# Patient Record
Sex: Female | Born: 1935
Health system: Southern US, Community
[De-identification: ages and names within clinical notes are randomized; demographics above are authoritative.]

## PROBLEM LIST (undated history)

## (undated) DIAGNOSIS — M810 Age-related osteoporosis without current pathological fracture: Secondary | ICD-10-CM

## (undated) DIAGNOSIS — K219 Gastro-esophageal reflux disease without esophagitis: Secondary | ICD-10-CM

## (undated) DIAGNOSIS — E079 Disorder of thyroid, unspecified: Secondary | ICD-10-CM

## (undated) DIAGNOSIS — M1990 Unspecified osteoarthritis, unspecified site: Secondary | ICD-10-CM

## (undated) HISTORY — DX: Disorder of thyroid, unspecified: E07.9

## (undated) HISTORY — DX: Gastro-esophageal reflux disease without esophagitis: K21.9

## (undated) HISTORY — DX: Age-related osteoporosis without current pathological fracture: M81.0

## (undated) HISTORY — DX: Unspecified osteoarthritis, unspecified site: M19.90

---

## 2001-04-17 ENCOUNTER — Ambulatory Visit: Admission: RE | Admit: 2001-04-17 | Discharge: 2001-07-16 | Payer: Self-pay | Admitting: Radiation Oncology

## 2004-02-18 ENCOUNTER — Emergency Department (HOSPITAL_COMMUNITY): Admission: EM | Admit: 2004-02-18 | Discharge: 2004-02-18 | Payer: Self-pay | Admitting: Emergency Medicine

## 2004-03-20 ENCOUNTER — Encounter: Admission: RE | Admit: 2004-03-20 | Discharge: 2004-03-20 | Payer: Self-pay | Admitting: Orthopedic Surgery

## 2005-08-31 IMAGING — CR DG ANKLE COMPLETE 3+V*L*
3 series · 3 of 3 positions shown · non-contrast
Comparison: none

CLINICAL DATA: Fall.  Left ankle twisting injury.  Pain and swelling.
 LEFT ANKLE ? 3 VIEW:
 An oblique nondisplaced fracture of the distal fibula is seen just superior to the level of the tibial plafond.  There is no evidence of distal tibial fracture or other ankle fracture.  The talus remains centered within the ankle mortise.  A small ankle joint effusion is noted.

[view not recorded (1 of 3)]
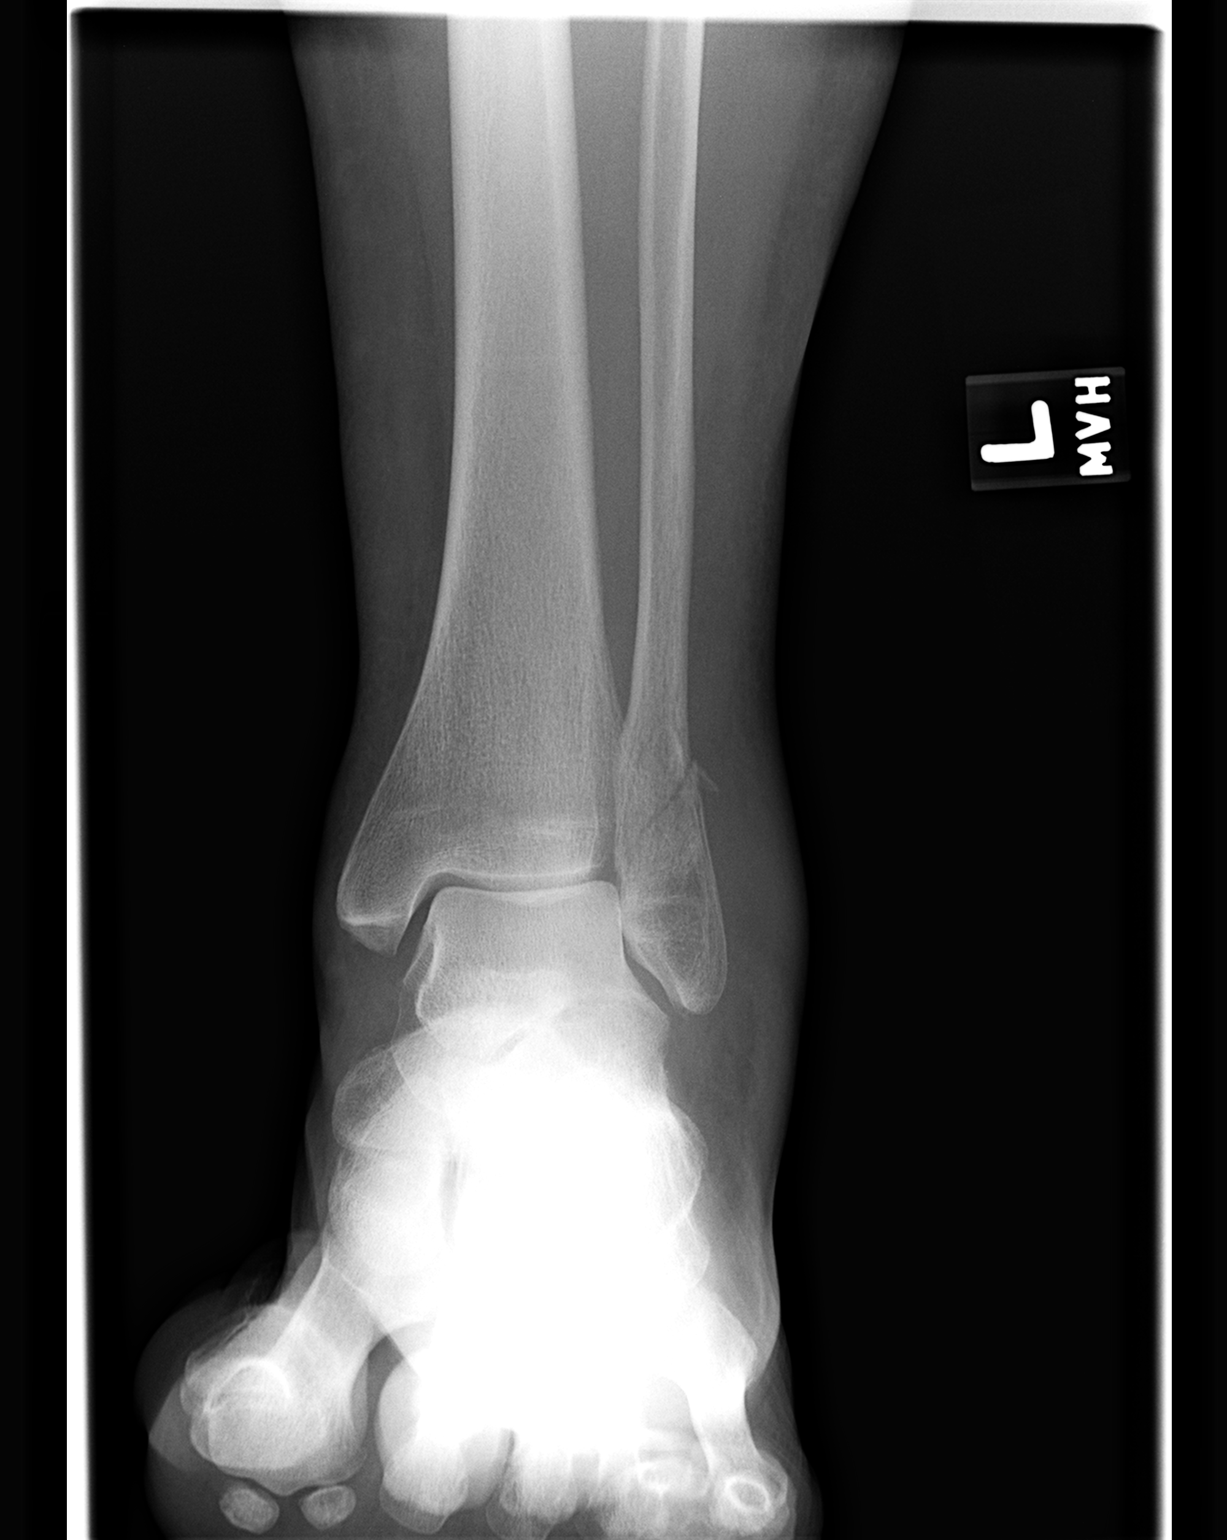

[view not recorded (2 of 3)]
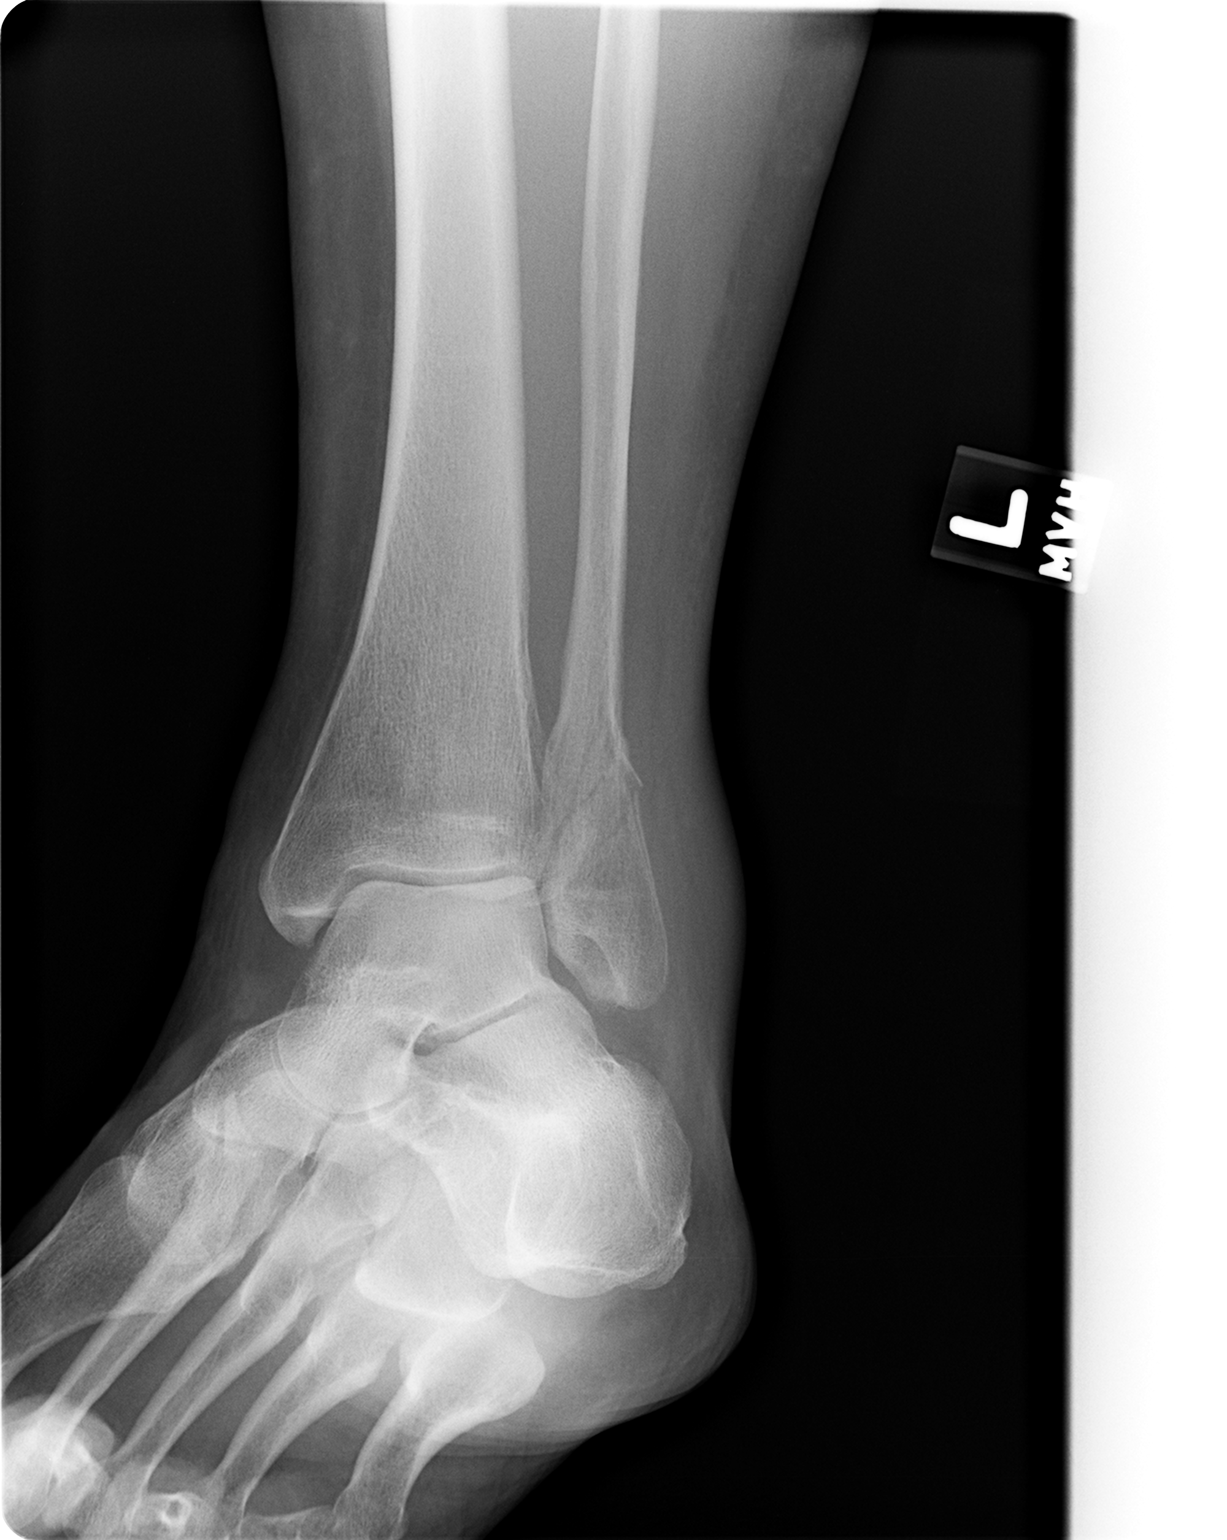

[view not recorded (3 of 3)]
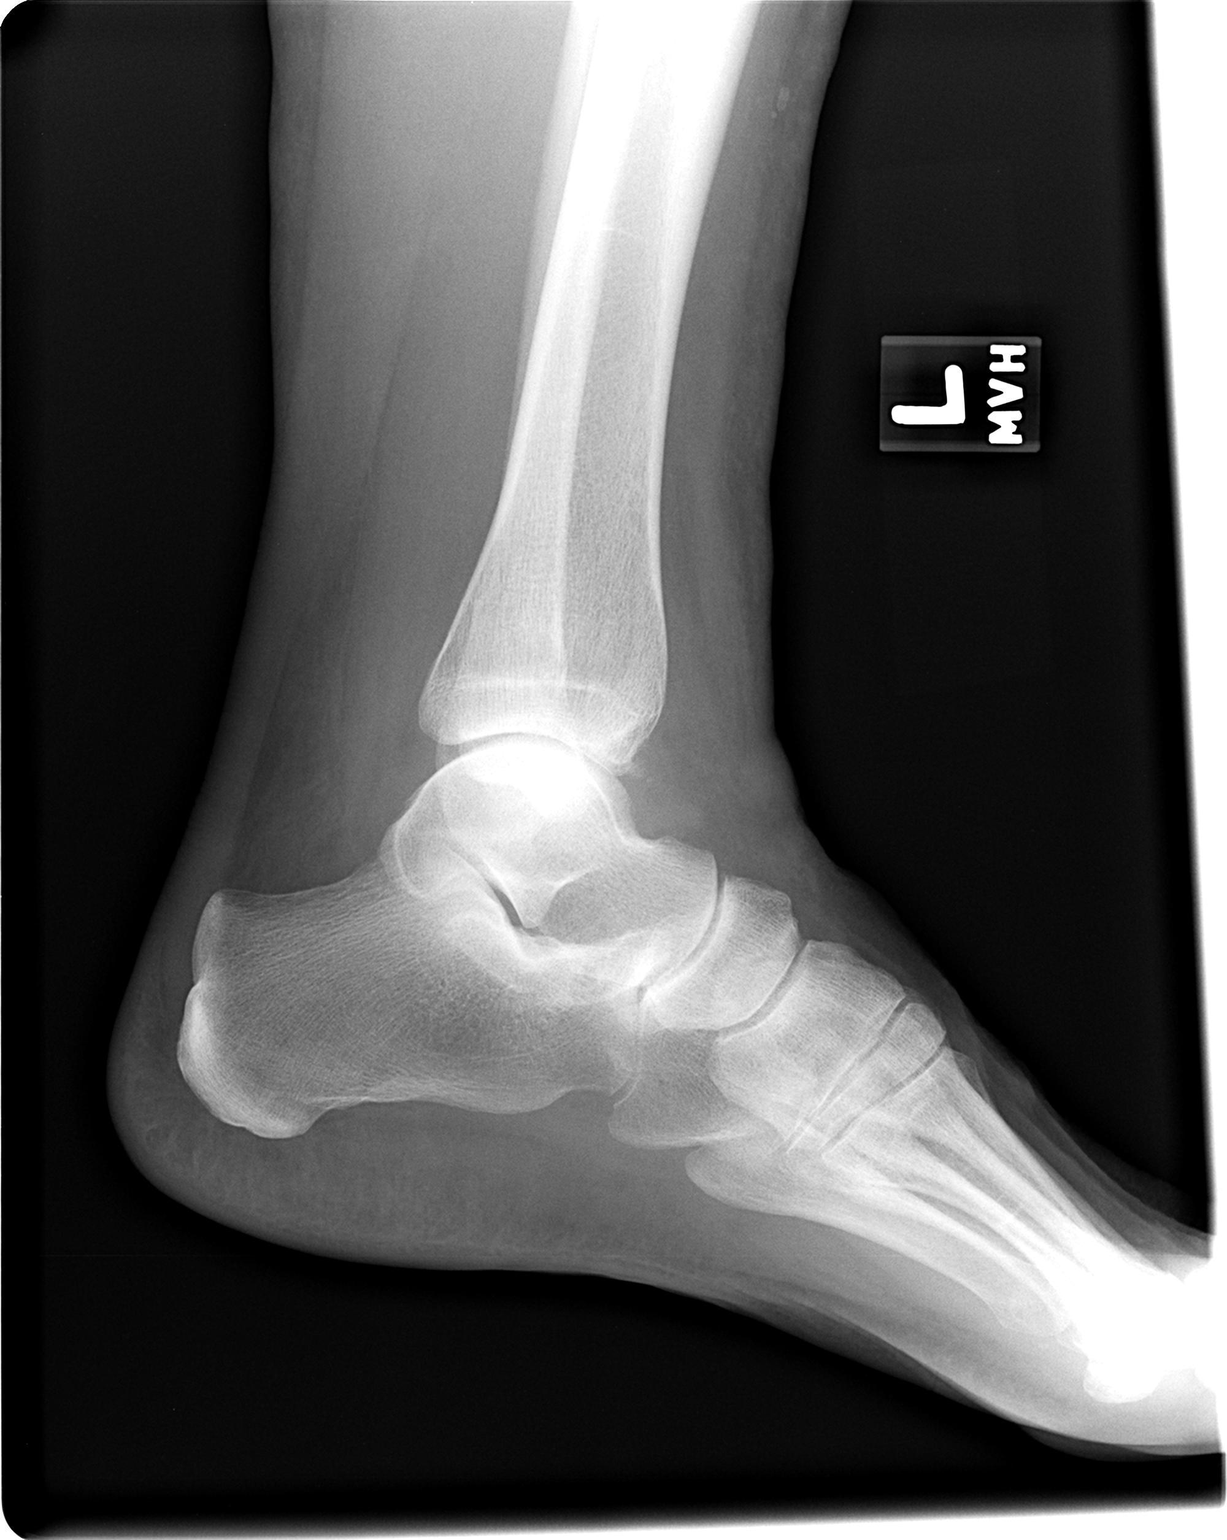

[3 of 3 positions shown; findings below may reference images not displayed]

IMPRESSION: Nondisplaced oblique fracture of the distal fibula, just superior to the level of the tibial plafond.

## 2014-12-20 DIAGNOSIS — H3560 Retinal hemorrhage, unspecified eye: Secondary | ICD-10-CM | POA: Insufficient documentation

## 2014-12-20 DIAGNOSIS — R233 Spontaneous ecchymoses: Secondary | ICD-10-CM | POA: Insufficient documentation

## 2014-12-20 LAB — TSH: TSH: 1.87 u[IU]/mL (ref 0.41–5.90)

## 2014-12-20 LAB — LIPID PANEL
Cholesterol: 210 mg/dL — AB (ref 0–200)
HDL: 86 mg/dL — AB (ref 35–70)
LDL Cholesterol: 114 mg/dL
Triglycerides: 53 mg/dL (ref 40–160)

## 2014-12-20 LAB — HEPATIC FUNCTION PANEL
ALT: 20 U/L (ref 7–35)
AST: 25 U/L (ref 13–35)

## 2014-12-20 LAB — BASIC METABOLIC PANEL
BUN: 17 mg/dL (ref 4–21)
CREATININE: 0.7 mg/dL (ref 0.5–1.1)
GLUCOSE: 95 mg/dL
POTASSIUM: 4.9 mmol/L (ref 3.4–5.3)
Sodium: 140 mmol/L (ref 137–147)

## 2015-01-10 LAB — CBC AND DIFFERENTIAL
HCT: 36 % (ref 36–46)
Hemoglobin: 12 g/dL (ref 12.0–16.0)
Platelets: 286 10*3/uL (ref 150–399)
WBC: 5.9 10*3/mL

## 2015-08-09 ENCOUNTER — Encounter: Payer: Self-pay | Admitting: Family Medicine

## 2015-08-09 ENCOUNTER — Ambulatory Visit (INDEPENDENT_AMBULATORY_CARE_PROVIDER_SITE_OTHER): Payer: Medicare PPO | Admitting: Family Medicine

## 2015-08-09 VITALS — BP 130/82 | HR 80 | Temp 98.0°F | Resp 12 | Ht 60.0 in | Wt 107.0 lb

## 2015-08-09 DIAGNOSIS — M542 Cervicalgia: Secondary | ICD-10-CM | POA: Insufficient documentation

## 2015-08-09 DIAGNOSIS — L658 Other specified nonscarring hair loss: Secondary | ICD-10-CM | POA: Diagnosis not present

## 2015-08-09 DIAGNOSIS — E039 Hypothyroidism, unspecified: Secondary | ICD-10-CM | POA: Insufficient documentation

## 2015-08-09 DIAGNOSIS — E038 Other specified hypothyroidism: Secondary | ICD-10-CM | POA: Diagnosis not present

## 2015-08-09 LAB — T4, FREE: Free T4: 0.76 ng/dL (ref 0.60–1.60)

## 2015-08-09 LAB — T3, FREE: T3 FREE: 3.1 pg/mL (ref 2.3–4.2)

## 2015-08-09 LAB — TSH: TSH: 1.45 u[IU]/mL (ref 0.35–4.50)

## 2015-08-09 MED ORDER — THYROID 60 MG PO TABS: 60.0000 mg | ORAL_TABLET | Freq: Every day | ORAL | Status: DC

## 2015-08-09 NOTE — Patient Instructions (Addendum)
A few things to remember from today's visit:   Other specified hypothyroidism - Plan: TSH, T4, free, T3, free   We have ordered labs or studies at this visit.    It can take up to 1-2 weeks for results and processing. IF results require follow up or explanation, we will call you with instructions. Clinically stable results will be released to your [facility]. If you have not heard from [place] or cannot find your results in [facility] in 2 weeks please contact our office at [phone].  If you are not yet signed up for [facility], please consider signing up  I will see her back when you doing physical. Keep appointment with dermatology and continue physical therapy/ortho for your neck pain. Remember to be cautious with falls, some more slowly.  Please be sure medication list is accurate. If a new problem present, please set up appointment sooner than planned today.

## 2015-08-09 NOTE — Progress Notes (Signed)
Pre visit review using our clinic review tool, if applicable. No additional management support is needed unless otherwise documented below in the visit note. 

## 2015-08-09 NOTE — Progress Notes (Signed)
HPI:   [name] is a 82 y.o. female, who is here today to follow on thyroid disease. She has been a patient of mine for about a year now, former patient of Development worker, community.    Hypothyroidism:  Currently she is on Armour Thyroid 60 mg daily. She states that she tried Synthroid and other medications but they didn't seem to work, she been taking this one for about 40 years.  Tolerating medication well, no side effects reported. She has not noted dysphagia, palpitations, abdominal pain, changes in bowel habits, tremor, cold/heat intolerance, or abnormal weight loss.  Concerns today: Hair loss.  She has history of chronic upper back/cervical pain, she is following with orthopedist and currently she is doing physical therapy and home exercises, going through dry needle therapy, which seems to be helping some.  She also follows with a dermatologist periodically, next appointment in the next couple weeks. Today she is complaining of hair loss, she doesn't have any alopecic areas but she states that her hair is getting "thinner." She has not noted scalp lesions.   She lives with husband. Independent ADL's and IADL's. No falls in the past year and denies depression symptoms.    Review of Systems  Constitutional: Negative for fever, activity change, appetite change, fatigue and unexpected weight change.  HENT: Negative for mouth sores, nosebleeds and trouble swallowing.   Eyes: Negative for redness and visual disturbance.  Respiratory: Negative for cough, shortness of breath and wheezing.   Cardiovascular: Negative for chest pain, palpitations and leg swelling.  Gastrointestinal: Negative for nausea, vomiting and abdominal pain.       Negative for changes in bowel habits.  Endocrine: Negative for cold intolerance and heat intolerance.  Genitourinary: Negative for dysuria, hematuria, decreased urine volume and difficulty urinating.  Musculoskeletal: Positive for back  pain and arthralgias. Negative for gait problem.  Skin: Negative for color change and rash.  Neurological: Negative for tremors, seizures, syncope, weakness, numbness and headaches.  Psychiatric/Behavioral: Negative for confusion. The patient is not nervous/anxious.       No current outpatient prescriptions on file prior to visit.   No current facility-administered medications on file prior to visit.     History reviewed. No pertinent past medical history. Not on File  Social History   Social History  . Marital Status: Married    Spouse Name: N/A  . Number of Children: N/A  . Years of Education: N/A   Social History Main Topics  . Smoking status: None  . Smokeless tobacco: None  . Alcohol Use: None  . Drug Use: None  . Sexual Activity: Not Asked   Other Topics Concern  . None   Social History Narrative  . None    Filed Vitals:   08/09/15 0936  BP: 130/82  Pulse: 80  Temp: 98 F (36.7 C)  Resp: 12   Body mass index is 20.9 kg/(m^2).   SpO2 Readings from Last 3 Encounters:  08/09/15 98%      Physical Exam  Nursing note and vitals reviewed. Constitutional: She is oriented to person, place, and time. She appears well-developed. No distress.  HENT:  Head: Atraumatic.  Mouth/Throat: Oropharynx is clear and moist and mucous membranes are normal.  Eyes: Conjunctivae and EOM are normal. Pupils are equal, round, and reactive to light.  Neck: No tracheal deviation present. No thyroid mass and no thyromegaly present.  Cardiovascular: Normal rate and regular rhythm.   No murmur heard. Pulses:  Dorsalis pedis pulses are 2+ on the right side, and 2+ on the left side.  Respiratory: Effort normal and breath sounds normal. No respiratory distress.  GI: Soft. She exhibits no mass. There is no tenderness.  Musculoskeletal: She exhibits no edema.  Lymphadenopathy:    She has no cervical adenopathy.  Neurological: She is alert and oriented to person, place, and  time. She has normal strength. No cranial nerve deficit. Coordination normal.  Stable gait without assistance  Skin: Skin is warm. No erythema.  No alopecic areas noted or scalp lesions.  Psychiatric: She has a normal mood and affect.  Well groomed, good eye contact.      ASSESSMENT AND PLAN:     Adriana Peters was seen today for new patient (initial visit).  Diagnoses and all orders for this visit:  Other specified hypothyroidism  No changes in current management, will follow labs done today and will give further recommendations accordingly. -     TSH -     T4, free -     T3, free -     thyroid (ARMOUR THYROID) 60 MG tablet; Take 1 tablet (60 mg total) by mouth daily before breakfast.   Other specified nonscarring hair loss  In regard to hair loss, he seems to be chronic, she has an appointment coming up with dermatology;so I recommended to discussed this problem during her next office visit.     -Ms. was advised to return sooner than planned today if new concerns arise, she voices understanding.       [name]. [name], MD  [facility]. [place] office.

## 2015-08-15 ENCOUNTER — Telehealth: Payer: Self-pay | Admitting: Family Medicine

## 2015-08-15 NOTE — Telephone Encounter (Signed)
Received PA request for Armour Thyroid 60mg  tablet. PA submitted & is pending. [id number]

## 2015-09-04 ENCOUNTER — Telehealth: Payer: Self-pay

## 2015-09-04 NOTE — Telephone Encounter (Signed)
Prior Authorization request for thyroid (ARMOUR THYROID) 60 MG tablet denial received.  Denied under Medicare Part B:  The drug you asked for is non-formulary. Before the drug can be covered, we need more information. Please ask your prescriber to explain to [facility] why the preferred drugs have not worked for your medical condition and/or would have bad side effects. The list of drugs offered by your current [facility] contract can be found in your 2017 Prescription Drug Guide. This was sent to you with your benefit packet. If you have not tried the preferred drugs, including levothyroxine tablet, please talk to your health care provider about prescribing one of these for you. Some preferred drugs may require an additional review and approval by [facility]. This determination was based on the [employer] Non-Formulary Exceptions Coverage Policy.

## 2015-09-26 ENCOUNTER — Telehealth: Payer: Self-pay | Admitting: Family Medicine

## 2015-09-26 DIAGNOSIS — Z13820 Encounter for screening for osteoporosis: Secondary | ICD-10-CM

## 2015-09-26 NOTE — Telephone Encounter (Signed)
Referral okay? 

## 2015-09-26 NOTE — Telephone Encounter (Signed)
The patient needs a referral to [facility].  [id number] [place], [place] [address] [phone] Fax: [phone]  She had her Bone Density done on 06/11/2013  Pulmonary Nodules 09/16/2014 and she needs them rechecked 10/17/15. She will make that appointment after 10/17/15.

## 2015-09-27 NOTE — Telephone Encounter (Signed)
Order placed

## 2015-09-27 NOTE — Telephone Encounter (Signed)
It is Ok to place referral for DEXA. Medicare guidelines changed, so we need a Dx that covers it. Thanks, BJ

## 2015-09-29 ENCOUNTER — Telehealth: Payer: Self-pay | Admitting: Family Medicine

## 2015-09-29 NOTE — Telephone Encounter (Signed)
Adriana Peters from [facility] needs a new bone density order. The order that they received doesn't have a date of birth or a referring doctors signature.   Order #: [id number]  Fax #: [phone]

## 2015-10-03 NOTE — Telephone Encounter (Signed)
New order faxed to [place].

## 2015-10-03 NOTE — Telephone Encounter (Signed)
Order page with patient's date of birth is printed & waiting for MD signature.

## 2015-10-11 ENCOUNTER — Telehealth: Payer: Self-pay | Admitting: Family Medicine

## 2015-10-11 NOTE — Telephone Encounter (Signed)
Pt would like to have a referral to have her Pulmonary Nodules checked and pls send it to:  [facility]   [place], [place]  [phone]  [phone] (F) (Same place she had her bone density done)

## 2015-10-11 NOTE — Telephone Encounter (Signed)
Order okay? 

## 2015-10-13 NOTE — Telephone Encounter (Signed)
Does she need order for CT or referral to provider who is following lung nodule? I think she is following with cardiothoracic surgeon or pulmonologist for this problem. Thanks, BJ

## 2015-10-13 NOTE — Telephone Encounter (Signed)
Left voicemail for patient to call the office back.   

## 2015-10-16 ENCOUNTER — Other Ambulatory Visit: Payer: Self-pay | Admitting: Family Medicine

## 2015-10-16 ENCOUNTER — Telehealth: Payer: Self-pay

## 2015-10-16 ENCOUNTER — Encounter: Payer: Self-pay | Admitting: Family Medicine

## 2015-10-16 DIAGNOSIS — R911 Solitary pulmonary nodule: Secondary | ICD-10-CM

## 2015-10-16 NOTE — Telephone Encounter (Signed)
She needs the referral for the CT and it has to be sent to [facility] in [place].

## 2015-10-16 NOTE — Telephone Encounter (Signed)
Referral for CT w/o contrast placed. I found report from prior CT done 08/2014, it was small and f/u recommended based on risk factors for lung cancer.   [Stable 5 mm left pulmonary nodule. Followup should be based on  Fleischner criteria. Please see below.  2.Moderate sliding hiatal hernia.]  Thanks, BJ

## 2015-10-16 NOTE — Telephone Encounter (Signed)
Order printed and signed by provider. Order requisition given to referrals to get authorized.

## 2015-10-16 NOTE — Telephone Encounter (Signed)
Called and left message for patient letting her know that we were able to have her records resent from [facility] @ [place].

## 2015-10-24 ENCOUNTER — Encounter: Payer: Self-pay | Admitting: Family Medicine

## 2015-10-24 DIAGNOSIS — G8929 Other chronic pain: Secondary | ICD-10-CM | POA: Insufficient documentation

## 2015-10-24 DIAGNOSIS — M545 Low back pain, unspecified: Secondary | ICD-10-CM | POA: Insufficient documentation

## 2016-02-08 ENCOUNTER — Ambulatory Visit (INDEPENDENT_AMBULATORY_CARE_PROVIDER_SITE_OTHER): Payer: Medicare PPO | Admitting: Family Medicine

## 2016-02-08 ENCOUNTER — Encounter: Payer: Self-pay | Admitting: Family Medicine

## 2016-02-08 VITALS — BP 148/68 | HR 90 | Temp 98.2°F | Resp 12 | Wt 108.0 lb

## 2016-02-08 DIAGNOSIS — K219 Gastro-esophageal reflux disease without esophagitis: Secondary | ICD-10-CM

## 2016-02-08 DIAGNOSIS — R030 Elevated blood-pressure reading, without diagnosis of hypertension: Secondary | ICD-10-CM

## 2016-02-08 DIAGNOSIS — K449 Diaphragmatic hernia without obstruction or gangrene: Secondary | ICD-10-CM

## 2016-02-08 DIAGNOSIS — W19XXXA Unspecified fall, initial encounter: Secondary | ICD-10-CM | POA: Diagnosis not present

## 2016-02-08 DIAGNOSIS — R911 Solitary pulmonary nodule: Secondary | ICD-10-CM

## 2016-02-08 DIAGNOSIS — S3992XA Unspecified injury of lower back, initial encounter: Secondary | ICD-10-CM | POA: Diagnosis not present

## 2016-02-08 NOTE — Progress Notes (Signed)
HPI:   [name] is a 83 y.o. female, who is here today to follow on some of her chronic problems and with some concerns after chest CT was done to follow on pulmonary nodule.   Hx of cervical pain, she is doing "fine." She is doing PT exercises.  -Hx of solitary pulmonary nodule, left lung. 09/2015 CT showed stable 6 mm nodule. No further follow up is necessary. She is concerned about hiatal hernia, which has been also reported when she has CT's done. She noted that previously hiatal hernia was reported as mild and now moderate. She has had heartburn, has refused medications.She takes vineger with baking soda and helps but she doe snot take it daily. She notes symptoms mainly at night,right after taking shower and getting ready to go to bed.  She is trying to follow GERD recommendations, follows a healthy diet. Drinks wine at night and goes to bed about 2 hours after dinner, + vomiting undigested food. She denies dysphagia.  Denies abdominal pain,changes in bowel habits, blood in stool or melena.  -Fall: 02/01/16 she fell at home when she was walking a narrow space between dishwasher and kitchen furniture. She landed on dishes, right rib cage. She is still "sore", no ecchymosis or deformity. Pain is 8/10, no radiated, , exacerbated by lying on right side or certain movements.Allicated by sitting/rest. Improving.  She denies cough, dyspnea, or wheezing.   She is exercising regularly, following Osteo Strong exercises to help with bone density. She states that she has gained strength and feels much better. She is independent and does not use cane or assistance for transfer.  BP elevated today. She does not check BP regularly, no Hx of HTN.     Review of Systems  Constitutional: Negative for activity change, appetite change, fatigue, fever and unexpected weight change.  HENT: Negative for mouth sores, nosebleeds and trouble swallowing.   Eyes: Negative for visual  disturbance.  Respiratory: Negative for cough, shortness of breath and wheezing.   Cardiovascular: Negative for chest pain, palpitations and leg swelling.  Gastrointestinal: Positive for vomiting. Negative for abdominal pain and blood in stool.       No changes in bowel habits.  Endocrine: Negative for cold intolerance and heat intolerance.  Genitourinary: Negative for decreased urine volume and hematuria.  Musculoskeletal: Positive for myalgias. Negative for gait problem.  Skin: Negative for rash.  Neurological: Negative for syncope, weakness and headaches.  Psychiatric/Behavioral: Negative for confusion and sleep disturbance.      Current Outpatient Prescriptions on File Prior to Visit  Medication Sig Dispense Refill  . thyroid (ARMOUR THYROID) 60 MG tablet Take 1 tablet (60 mg total) by mouth daily before breakfast. 90 tablet 2   No current facility-administered medications on file prior to visit.      No past medical history on file. Not on File  Social History   Social History  . Marital status: Married    Spouse name: N/A  . Number of children: N/A  . Years of education: N/A   Social History Main Topics  . Smoking status: Never Smoker  . Smokeless tobacco: Never Used  . Alcohol use Not on file  . Drug use: Unknown  . Sexual activity: Not on file   Other Topics Concern  . Not on file   Social History Narrative  . No narrative on file    Vitals:   02/08/16 1316  BP: (!) 148/68  Pulse: 90  Resp:  12  Temp: 98.2 F (36.8 C)   Body mass index is 21.09 kg/m.    Physical Exam  Nursing note and vitals reviewed. Constitutional: She is oriented to person, place, and time. She appears well-developed and well-nourished. No distress.  HENT:  Head: Atraumatic.  Mouth/Throat: Oropharynx is clear and moist and mucous membranes are normal.  Eyes: Conjunctivae and EOM are normal. Pupils are equal, round, and reactive to light.  Cardiovascular: Normal rate and  regular rhythm.   No murmur heard. Pulses:      Dorsalis pedis pulses are 2+ on the right side, and 2+ on the left side.  Respiratory: Effort normal and breath sounds normal. No respiratory distress.  GI: Soft. She exhibits no mass. There is no hepatomegaly. There is no tenderness.  Musculoskeletal: She exhibits no edema.  Right low back tenderness upon palpation, posterior rib cage. No deformity appreciated.  Lymphadenopathy:    She has no cervical adenopathy.  Neurological: She is alert and oriented to person, place, and time. She has normal strength. Coordination normal.  Stable gait with no assistance needed.   Skin: Skin is warm. No ecchymosis and no rash noted. No erythema.  Psychiatric: Her mood appears anxious.  Well groomed, good eye contact.      ASSESSMENT AND PLAN:     Debrah was seen today for fall.  Diagnoses and all orders for this visit:   Accidental fall, initial encounter  Fall precautions discussed. She will continue regular strengthening exercises.    Soft tissue injury of back, initial encounter  Examination today does not suggest serious trauma/injury. I do not think imaging is needed today. Acetaminophen and topical OTC Asper cream may help. Instructed about warning signs. F/U as needed.   Hiatal hernia with GERD  We discussed characteristics of disease. She is symptomatic. Recommended trial of PPI but she refuses medications.She will prefer to try vinegar with baking soda but daily. GERD precautions discussed. She will let me know in 3-4 weeks if home remedy is helping, then she tells me she may agree with Omeprazole.   Elevated blood pressure reading  Re-checked 145/70. Monitor BP 1-2 times daily recommended. F/U in 4 months.  Solitary pulmonary nodule  Stable. No further imaging needed.    -[name] was advised to return sooner than planned today if new concerns arise.       Maimouna Rondeau G. [name], MD  [facility]. [place] office.

## 2016-02-08 NOTE — Progress Notes (Signed)
Pre visit review using our clinic review tool, if applicable. No additional management support is needed unless otherwise documented below in the visit note. 

## 2016-02-08 NOTE — Patient Instructions (Addendum)
A few things to remember from today's visit:   Hiatal hernia with GERD    Avoid foods that make your symptoms worse, for example coffee, chocolate,pepermeint,alcohol, and greasy food. Raising the head of your bed about 6 inches may help with nocturnal symptoms.   Avoid lying down for 3 hours after eating.  Instead 3 large meals daily try small and more frequent meals during the day.    You should be evaluated immediately if bloody vomiting, bloody stools, black stools (like tar), difficulty swallowing, food gets stuck on the way down or choking when eating. Abnormal weight loss or severe abdominal pain.  If symptoms are not resolved sometimes endoscopy is necessary.   Caution with falls. Blood pressure at home.    Please be sure medication list is accurate. If a new problem present, please set up appointment sooner than planned today.

## 2016-02-11 ENCOUNTER — Encounter: Payer: Self-pay | Admitting: Family Medicine

## 2016-02-12 ENCOUNTER — Encounter: Payer: Self-pay | Admitting: Family Medicine

## 2016-02-12 ENCOUNTER — Ambulatory Visit (INDEPENDENT_AMBULATORY_CARE_PROVIDER_SITE_OTHER)
Admission: RE | Admit: 2016-02-12 | Discharge: 2016-02-12 | Disposition: A | Discharge status: Home / Self Care | Payer: Medicare PPO | Source: Ambulatory Visit | Attending: Family Medicine | Admitting: Family Medicine

## 2016-02-12 ENCOUNTER — Ambulatory Visit (INDEPENDENT_AMBULATORY_CARE_PROVIDER_SITE_OTHER): Payer: Medicare PPO | Admitting: Family Medicine

## 2016-02-12 VITALS — BP 112/78 | HR 78 | Resp 12 | Ht 60.0 in | Wt 106.5 lb

## 2016-02-12 DIAGNOSIS — W19XXXD Unspecified fall, subsequent encounter: Secondary | ICD-10-CM | POA: Diagnosis not present

## 2016-02-12 DIAGNOSIS — M545 Low back pain, unspecified: Secondary | ICD-10-CM

## 2016-02-12 DIAGNOSIS — R0781 Pleurodynia: Secondary | ICD-10-CM

## 2016-02-12 MED ORDER — DICLOFENAC SODIUM 1 % TD GEL: 4.0000 g | Freq: Four times a day (QID) | TRANSDERMAL | 1 refills | Status: DC

## 2016-02-12 NOTE — Progress Notes (Signed)
Pre visit review using our clinic review tool, if applicable. No additional management support is needed unless otherwise documented below in the visit note. 

## 2016-02-12 NOTE — Patient Instructions (Signed)
  [name] I have seen you today for an acute visit.  1. Right-sided low back pain without sciatica, unspecified chronicity  - DG Lumbar Spine Complete; Future  2. Costal margin pain  - DG Ribs Unilateral Right; Future  3. Accidental fall, subsequent encounter  - DG Ribs Unilateral Right; Future - DG Lumbar Spine Complete; Future    In general please monitor for signs of worsening symptoms and seek immediate medical attention if any concerning/warning symptom.

## 2016-02-12 NOTE — Progress Notes (Signed)
HPI:  ACUTE VISIT:  Chief Complaint  Patient presents with  . Follow-up    Adriana Peters is a 83 y.o. female, who is here today complaining of worsening right lower back pain and rib cage for the past few days.  I saw her recently, 02/08/2016 when she reported accidental fall at home on 02/01/2016. According to patient, pain has been "much worse",intermittent.It is exacerbated by certain activities that involve bending, pulling, and reaching. Localized mainly on right low back but also having some on rib cage.  Sharp, 9-10/10, no radiated. Denies saddle anesthesia.   He is alleviated by rest and sitting. She has not noted any deformity. She is reporting history of scoliosis and is concerned that all might have caused problems with her back.  She denies numbness, tingling, cough, dyspnea, wheezing, or abdominal pain. She is using home remedies to manage pain.  She has Hx of chronic cervical and lower back pain as well as osteopenia.     Review of Systems  Constitutional: Negative for activity change, appetite change, fatigue and fever.  Respiratory: Negative for cough, shortness of breath and wheezing.   Cardiovascular: Negative for chest pain.  Gastrointestinal: Negative for abdominal pain, nausea and vomiting.       Negative for changes in bowel habits or fecal incontinence.  Genitourinary: Negative for decreased urine volume and hematuria.       Negative for urine incontinence.  Musculoskeletal: Positive for back pain. Negative for neck pain.  Skin: Negative for rash.  Neurological: Negative for syncope, weakness, numbness and headaches.  Psychiatric/Behavioral: Negative for confusion. The patient is nervous/anxious.       Current Outpatient Prescriptions on File Prior to Visit  Medication Sig Dispense Refill  . thyroid (ARMOUR THYROID) 60 MG tablet Take 1 tablet (60 mg total) by mouth daily before breakfast. 90 tablet 2   No current  facility-administered medications on file prior to visit.      No past medical history on file. Not on File  Social History   Social History  . Marital status: Married    Spouse name: N/A  . Number of children: N/A  . Years of education: N/A   Social History Main Topics  . Smoking status: Never Smoker  . Smokeless tobacco: Never Used  . Alcohol use Yes     Comment: Wine  . Drug use: No  . Sexual activity: Not Asked   Other Topics Concern  . None   Social History Narrative  . None    Vitals:   02/12/16 1122  BP: 112/78  Pulse: 78  Resp: 12   O2 sat 99% at RA.  Body mass index is 20.8 kg/m.  Physical Exam  Nursing note and vitals reviewed. Constitutional: She is oriented to person, place, and time. She appears well-developed. She does not appear ill. No distress.  HENT:  Head: Atraumatic.  Eyes: Conjunctivae and EOM are normal.  Cardiovascular: Normal rate and regular rhythm.   Respiratory: Effort normal and breath sounds normal. No respiratory distress.  Musculoskeletal: She exhibits no edema.  Mild tenderness upon palpation of paraspinal muscles, right L2-3-4 with mild spasm. Right rib cage also tender with palpation. + Scoliosis. Pain elicited with movement. Shoulder ROM is not limited and doe snot elicit pain. No local edema or erythema appreciated, no suspicious lesions.     Neurological: She is alert and oriented to person, place, and time. She has normal strength. Coordination and gait normal.  Skin: Skin  is warm. No rash noted. No erythema.  Psychiatric: Her mood appears anxious.  Well groomed, good eye contact.      ASSESSMENT AND PLAN:     Fayma was seen today for follow-up.  Diagnoses and all orders for this visit:  Right-sided low back pain without sciatica, unspecified chronicity  Acute exacerbation of a chronic problem.  Most likely soft tissue trauma. She agrees with trying Voltaren gel. She can also try acetaminophen OTC  500 mg 3 times per day. She does not want oral analgesic Rx. Recommend avoiding activities that exacerbate pain. Instructed about warning signs.  -     DG Lumbar Spine Complete; Future -     diclofenac sodium (VOLTAREN) 1 % GEL; Apply 4 g topically 4 (four) times daily.  Costal margin pain  Examination does not suggest rib fracture, further recommendations would be given according to imaging report.   -     DG Ribs Unilateral Right; Future -     diclofenac sodium (VOLTAREN) 1 % GEL; Apply 4 g topically 4 (four) times daily.  Accidental fall, subsequent encounter  Fall precautions were discussed extensively last office visit, will review some today.  -     DG Ribs Unilateral Right; Future -     DG Lumbar Spine Complete; Future    -[name] was advised to return or notify a doctor immediately if symptoms worsen or persist or new concerns arise.       Melanee Cordial G. [name], MD  [facility]. [place] office.

## 2016-05-02 ENCOUNTER — Other Ambulatory Visit: Payer: Self-pay

## 2016-05-02 DIAGNOSIS — E038 Other specified hypothyroidism: Secondary | ICD-10-CM

## 2016-05-02 MED ORDER — THYROID 60 MG PO TABS: 60.0000 mg | ORAL_TABLET | Freq: Every day | ORAL | 1 refills | Status: DC

## 2016-06-03 NOTE — Progress Notes (Signed)
HPI:   [name] is a 83 y.o. female, who is here today to follow on some chronic medical problems.  Last seen for acute visit on 02/12/16, rib fractures after fall at home. Pain has resolved.  Hypothyroidism:  Dx about 40+ years.  Currently she is on Armour Thyroid 60 mg daily. . Tolerating medication well, no side effects reported. She has not noted dysphagia, palpitations, abdominal pain, changes in bowel habits, tremor, cold/heat intolerance, or abnormal weight loss.  Lab Results  Component Value Date   TSH 1.45 08/09/2015    GERD:  Still having "some throwing up" once per week regurgitation. She wonders if she is eating to much, exacerbated by bending froward after eating. Sometimes "little" discomfort on epigastric area. Apple cider vineger and baking soda help.  She would like to go see GI but for later because she has no time. Hx of hiatal hernia. She has refused trying PPI's or Zantac. Denies abdominal pain, nausea, vomiting, changes in bowel habits, blood in stool or melena.   Cervicalgia: Arnica gel has helped with cervical pain and upper back pain. She occasionally has pain, very mild, depending of physical activity during the day.  Osteoporosis: Still doing osteo-strong for bone and taking Ca++ with Vit D supplementation. She has refused treatment with Fosamax or other medications for osteoporosis, she is afraid of side effects.   Osteopenia last DEXA 10/06/15 some planing on DEXA 10/06/16. She is planning on having DEXA again in 09/2016, states that her health insurance has covered it with no problems.  Concerned about LE varicose veins. She denies pain or erythema. Occasional mild LE edema, worse at the end of the day if she has been walking or prolonged standing.   Review of Systems  Constitutional: Negative for activity change, appetite change, fatigue, fever and unexpected weight change.  HENT: Negative for mouth sores, nosebleeds, sore  throat and trouble swallowing.   Respiratory: Negative for cough, shortness of breath and wheezing.   Cardiovascular: Negative for chest pain, palpitations and leg swelling.  Gastrointestinal: Negative for abdominal pain, blood in stool, nausea and vomiting.       Negative for changes in bowel habits.  Endocrine: Negative for cold intolerance and heat intolerance.  Genitourinary: Negative for decreased urine volume and hematuria.  Musculoskeletal: Negative for gait problem and myalgias.  Skin: Negative for pallor and rash.  Neurological: Negative for syncope, weakness and headaches.  Psychiatric/Behavioral: Negative for confusion and sleep disturbance. The patient is nervous/anxious.       Current Outpatient Prescriptions on File Prior to Visit  Medication Sig Dispense Refill  . thyroid (ARMOUR THYROID) 60 MG tablet Take 1 tablet (60 mg total) by mouth daily before breakfast. 90 tablet 1   No current facility-administered medications on file prior to visit.     Past Medical History:  Diagnosis Date  . Thyroid disease    Hypothyroidism.   No Known Allergies  Social History   Social History  . Marital status: Married    Spouse name: N/A  . Number of children: N/A  . Years of education: N/A   Social History Main Topics  . Smoking status: Never Smoker  . Smokeless tobacco: Never Used  . Alcohol use Yes     Comment: Wine  . Drug use: No  . Sexual activity: Not Asked   Other Topics Concern  . None   Social History Narrative  . None    Vitals:   06/04/16 1050  BP: 122/70  Pulse: 87  Resp: 12  O2 sat at RA 95% Body mass index is 20.56 kg/m.   Physical Exam  Nursing note and vitals reviewed. Constitutional: She is oriented to person, place, and time. She appears well-developed and well-nourished. No distress.  HENT:  Head: Atraumatic.  Mouth/Throat: Oropharynx is clear and moist and mucous membranes are normal.  Eyes: Conjunctivae and EOM are normal. Pupils  are equal, round, and reactive to light.  Neck: No tracheal deviation present. No thyroid mass and no thyromegaly present.  Cardiovascular: Normal rate and regular rhythm.   No murmur heard. Pulses:      Dorsalis pedis pulses are 2+ on the right side, and 2+ on the left side.  Varicose vein LE bilateral, moderate and involving calves and thighs.  Respiratory: Effort normal and breath sounds normal. No respiratory distress.  GI: Soft. She exhibits no mass. There is no hepatomegaly. There is no tenderness.  Musculoskeletal: She exhibits no edema.  Shoulders normal ROM, no pain elicited. Cervical paraspinal muscles no tenderness. Mild limitation of ROM of cervical spine, no pain elicited.   Lymphadenopathy:    She has no cervical adenopathy.  Neurological: She is alert and oriented to person, place, and time. She has normal strength.  Stable gait with no assistance.  Skin: Skin is warm. No erythema.  Psychiatric: She has a normal mood and affect.  Well groomed, good eye contact.     ASSESSMENT AND PLAN:   Adriana Peters was seen today for follow-up.  Diagnoses and all orders for this visit:  Hiatal hernia with GERD  We discussed possible complications of GERD. She is not interested in PPI or other pharmacologic treatment. She would like GI evaluation but not now because she will not have time. She will let me know when she can do it. Instructed about warning signs. GERD precautions discussed and to continue.  Other specified hypothyroidism  No changes in current management, will follow labs done today and will give further recommendations accordingly. F/U in a year.  -     T4, free -     T3, free -     TSH  Osteoporosis without current pathological fracture, unspecified osteoporosis type  She is not interested in pharmacologic treatment. She wants to continue having DEXA annually. We discussed current recommendations for DEXA. Continue Ca++ with D. Fall precautions  discussed.  Varicose veins of both lower extremities  Compression stocking recommended. LE elevation may also help. Skin care. Treatment options discussed but if she is not having significant symptoms I do not recommend invasive procedures.    -Adriana Peters was advised to return sooner than planned today if new concerns arise.       Raahil Ong G. [name], MD  [facility]. [place] office.

## 2016-06-04 ENCOUNTER — Encounter: Payer: Self-pay | Admitting: Family Medicine

## 2016-06-04 ENCOUNTER — Ambulatory Visit (INDEPENDENT_AMBULATORY_CARE_PROVIDER_SITE_OTHER): Payer: Medicare PPO | Admitting: Family Medicine

## 2016-06-04 VITALS — BP 122/70 | HR 87 | Resp 12 | Ht 60.0 in | Wt 105.2 lb

## 2016-06-04 DIAGNOSIS — K449 Diaphragmatic hernia without obstruction or gangrene: Secondary | ICD-10-CM

## 2016-06-04 DIAGNOSIS — M810 Age-related osteoporosis without current pathological fracture: Secondary | ICD-10-CM | POA: Insufficient documentation

## 2016-06-04 DIAGNOSIS — I8393 Asymptomatic varicose veins of bilateral lower extremities: Secondary | ICD-10-CM | POA: Diagnosis not present

## 2016-06-04 DIAGNOSIS — K219 Gastro-esophageal reflux disease without esophagitis: Secondary | ICD-10-CM | POA: Diagnosis not present

## 2016-06-04 DIAGNOSIS — E038 Other specified hypothyroidism: Secondary | ICD-10-CM

## 2016-06-04 NOTE — Patient Instructions (Addendum)
A few things to remember from today's visit:   Hiatal hernia with GERD  Other specified hypothyroidism  Osteoporosis without current pathological fracture, unspecified osteoporosis type    Avoid foods that make your symptoms worse, for example coffee, chocolate,pepermeint,alcohol, and greasy food. Raising the head of your bed about 6 inches may help with nocturnal symptoms.   Avoid lying down for 3 hours after eating.  Instead 3 large meals daily try small and more frequent meals during the day.  Every medication have side effects and medications for GERD are not the exception.At this time I think benefit is greater than risk.    You should be evaluated immediately if bloody vomiting, bloody stools, black stools (like tar), difficulty swallowing, food gets stuck on the way down or choking when eating. Abnormal weight loss or severe abdominal pain.  If symptoms are not resolved sometimes endoscopy is necessary.  Please be sure medication list is accurate. If a new problem present, please set up appointment sooner than planned today.

## 2016-08-12 ENCOUNTER — Ambulatory Visit (INDEPENDENT_AMBULATORY_CARE_PROVIDER_SITE_OTHER)
Admission: RE | Admit: 2016-08-12 | Discharge: 2016-08-12 | Disposition: A | Discharge status: Home / Self Care | Payer: Medicare PPO | Source: Ambulatory Visit | Attending: Internal Medicine | Admitting: Internal Medicine

## 2016-08-12 ENCOUNTER — Encounter: Payer: Self-pay | Admitting: Internal Medicine

## 2016-08-12 ENCOUNTER — Ambulatory Visit (INDEPENDENT_AMBULATORY_CARE_PROVIDER_SITE_OTHER): Payer: Medicare PPO | Admitting: Internal Medicine

## 2016-08-12 VITALS — BP 130/80 | HR 91 | Temp 98.6°F | Wt 105.8 lb

## 2016-08-12 DIAGNOSIS — R0781 Pleurodynia: Secondary | ICD-10-CM

## 2016-08-12 DIAGNOSIS — S299XXA Unspecified injury of thorax, initial encounter: Secondary | ICD-10-CM

## 2016-08-12 DIAGNOSIS — M810 Age-related osteoporosis without current pathological fracture: Secondary | ICD-10-CM | POA: Diagnosis not present

## 2016-08-12 DIAGNOSIS — S2232XA Fracture of one rib, left side, initial encounter for closed fracture: Secondary | ICD-10-CM

## 2016-08-12 NOTE — Patient Instructions (Addendum)
Balance  Exercises   And attuned to distraction as cause of fall and turn slowing .  Consider   Tai CHi .  May take 6 weeks to heal a rib injury fracture   Consider  Bone building therapy  Fu PCP if not improving      Fall Prevention in the Home Falls can cause injuries and can affect people from all age groups. There are many simple things that you can do to make your home safe and to help prevent falls. What can I do on the outside of my home?  Regularly repair the edges of walkways and driveways and fix any cracks.  Remove high doorway thresholds.  Trim any shrubbery on the main path into your home.  Use bright outdoor lighting.  Clear walkways of debris and clutter, including tools and rocks.  Regularly check that handrails are securely fastened and in good repair. Both sides of any steps should have handrails.  Install guardrails along the edges of any raised decks or porches.  Have leaves, snow, and ice cleared regularly.  Use sand or salt on walkways during winter months.  In the garage, clean up any spills right away, including grease or oil spills. What can I do in the bathroom?  Use night lights.  Install grab bars by the toilet and in the tub and shower. Do not use towel bars as grab bars.  Use non-skid mats or decals on the floor of the tub or shower.  If you need to sit down while you are in the shower, use a plastic, non-slip stool.  Keep the floor dry. Immediately clean up any water that spills on the floor.  Remove soap buildup in the tub or shower on a regular basis.  Attach bath mats securely with double-sided non-slip rug tape.  Remove throw rugs and other tripping hazards from the floor. What can I do in the bedroom?  Use night lights.  Make sure that a bedside light is easy to reach.  Do not use oversized bedding that drapes onto the floor.  Have a firm chair that has side arms to use for getting dressed.  Remove throw rugs and other  tripping hazards from the floor. What can I do in the kitchen?  Clean up any spills right away.  Avoid walking on wet floors.  Place frequently used items in easy-to-reach places.  If you need to reach for something above you, use a sturdy step stool that has a grab bar.  Keep electrical cables out of the way.  Do not use floor polish or wax that makes floors slippery. If you have to use wax, make sure that it is non-skid floor wax.  Remove throw rugs and other tripping hazards from the floor. What can I do in the stairways?  Do not leave any items on the stairs.  Make sure that there are handrails on both sides of the stairs. Fix handrails that are broken or loose. Make sure that handrails are as long as the stairways.  Check any carpeting to make sure that it is firmly attached to the stairs. Fix any carpet that is loose or worn.  Avoid having throw rugs at the top or bottom of stairways, or secure the rugs with carpet tape to prevent them from moving.  Make sure that you have a light switch at the top of the stairs and the bottom of the stairs. If you do not have them, have them installed. What are some  other fall prevention tips?  Wear closed-toe shoes that fit well and support your feet. Wear shoes that have rubber soles or low heels.  When you use a stepladder, make sure that it is completely opened and that the sides are firmly locked. Have someone hold the ladder while you are using it. Do not climb a closed stepladder.  Add color or contrast paint or tape to grab bars and handrails in your home. Place contrasting color strips on the first and last steps.  Use mobility aids as needed, such as canes, walkers, scooters, and crutches.  Turn on lights if it is dark. Replace any light bulbs that burn out.  Set up furniture so that there are clear paths. Keep the furniture in the same spot.  Fix any uneven floor surfaces.  Choose a carpet design that does not hide the  edge of steps of a stairway.  Be aware of any and all pets.  Review your medicines with your healthcare provider. Some medicines can cause dizziness or changes in blood pressure, which increase your risk of falling. Talk with your health care provider about other ways that you can decrease your risk of falls. This may include working with a physical therapist or trainer to improve your strength, balance, and endurance. This information is not intended to replace advice given to you by your health care provider. Make sure you discuss any questions you have with your health care provider. Document Released: 01/04/2002 Document Revised: 06/13/2015 Document Reviewed: 02/18/2014 Elsevier Interactive Patient Education  2017 [place] [employer]

## 2016-08-12 NOTE — Progress Notes (Signed)
Chief Complaint  Patient presents with  . Fall    HPI: Adriana Peters 83 y.o. sda  PCP NA  Had a fall aginst screen storm door  When  3 days ago  When checking on dog to leave house   And turned around [place] and fell  hoilding on  Storm door and rib  Hurts   Just had a   fracture  Right rib fracture .  In  jan see last note    This happened   Friday am  And  Over weekend ( 2-3 days) gettign worse. Pain no sob cough has  Scoliosis and spine arthritis   Takes a exercise class for osteoprosis  Balance xercises  Hx of med fo 5 years    No dizzy .   Loc cv sx  Feels well  Using  Natural methods for pain    ROS: See pertinent positives and negatives per HPI.  Past Medical History:  Diagnosis Date  . Thyroid disease    Hypothyroidism.    No family history on file.  Social History   Social History  . Marital status: Married    Spouse name: N/A  . Number of children: N/A  . Years of education: N/A   Social History Main Topics  . Smoking status: Never Smoker  . Smokeless tobacco: Never Used  . Alcohol use Yes     Comment: Wine  . Drug use: No  . Sexual activity: Not Asked   Other Topics Concern  . None   Social History Narrative  . None    Outpatient Medications Prior to Visit  Medication Sig Dispense Refill  . thyroid (ARMOUR THYROID) 60 MG tablet Take 1 tablet (60 mg total) by mouth daily before breakfast. 90 tablet 1   No facility-administered medications prior to visit.      EXAM:  BP 130/80 (BP Location: Left Arm, Patient Position: Sitting, Cuff Size: Normal)   Pulse 91   Temp 98.6 F (37 C) (Oral)   Wt 105 lb 12.8 oz (48 kg)   BMI 20.66 kg/m   Body mass index is 20.66 kg/m.  GENERAL: vitals reviewed and listed above, alert, oriented, appears well hydrated and in no acute distress mobil some kyophosis and scoliosis right  Appears younger than satated age  22: atraumatic, conjunctiva  clear, no obvious abnormalities on inspection of external  nose and ears  LUNGS: clear to auscultation bilaterally, no wheezes, rales or rhonchi, good air movement Tender left lower lateral ribs no step off or crepitus ? If faintly bruised or not  CV: HRRR, no clubbing cyanosisnl cap refill  MS: moves all extremities without noticeable focal  abnormality PSYCH: pleasant and cooperative, no obvious depression or anxiety Neuro intact  Non focal   ASSESSMENT AND PLAN:  Discussed the following assessment and plan:  Chest wall injury, initial encounter - Plan: DG Ribs Unilateral Left  Rib pain on left side - Plan: DG Ribs Unilateral Left  Age related osteoporosis, unspecified pathological fracture presence  Closed fracture of one rib of left side, initial encounter Arnica  Gel and   perles  3 x per day .   And tylenol  -Patient advised to return or notify health care team  if symptoms worsen ,persist or new concerns arise. Total visit 53mns > 50% spent counseling and coordinating care as indicated in above note and in instructions to patient .  Ideas prevention  Distraction   Triggers  Home  And balance  exercises etc .  Feels she has optimized  Factors    Patient Instructions  Balance  Exercises   And attuned to distraction as cause of fall and turn slowing .  Consider   Tai CHi .  May take 6 weeks to heal a rib injury fracture   Consider  Bone building therapy  Fu PCP if not improving      Fall Prevention in the Home Falls can cause injuries and can affect people from all age groups. There are many simple things that you can do to make your home safe and to help prevent falls. What can I do on the outside of my home?  Regularly repair the edges of walkways and driveways and fix any cracks.  Remove high doorway thresholds.  Trim any shrubbery on the main path into your home.  Use bright outdoor lighting.  Clear walkways of debris and clutter, including tools and rocks.  Regularly check that handrails are securely fastened and in good  repair. Both sides of any steps should have handrails.  Install guardrails along the edges of any raised decks or porches.  Have leaves, snow, and ice cleared regularly.  Use sand or salt on walkways during winter months.  In the garage, clean up any spills right away, including grease or oil spills. What can I do in the bathroom?  Use night lights.  Install grab bars by the toilet and in the tub and shower. Do not use towel bars as grab bars.  Use non-skid mats or decals on the floor of the tub or shower.  If you need to sit down while you are in the shower, use a plastic, non-slip stool.  Keep the floor dry. Immediately clean up any water that spills on the floor.  Remove soap buildup in the tub or shower on a regular basis.  Attach bath mats securely with double-sided non-slip rug tape.  Remove throw rugs and other tripping hazards from the floor. What can I do in the bedroom?  Use night lights.  Make sure that a bedside light is easy to reach.  Do not use oversized bedding that drapes onto the floor.  Have a firm chair that has side arms to use for getting dressed.  Remove throw rugs and other tripping hazards from the floor. What can I do in the kitchen?  Clean up any spills right away.  Avoid walking on wet floors.  Place frequently used items in easy-to-reach places.  If you need to reach for something above you, use a sturdy step stool that has a grab bar.  Keep electrical cables out of the way.  Do not use floor polish or wax that makes floors slippery. If you have to use wax, make sure that it is non-skid floor wax.  Remove throw rugs and other tripping hazards from the floor. What can I do in the stairways?  Do not leave any items on the stairs.  Make sure that there are handrails on both sides of the stairs. Fix handrails that are broken or loose. Make sure that handrails are as long as the stairways.  Check any carpeting to make sure that it is  firmly attached to the stairs. Fix any carpet that is loose or worn.  Avoid having throw rugs at the top or bottom of stairways, or secure the rugs with carpet tape to prevent them from moving.  Make sure that you have a light switch at the top of the stairs and  the bottom of the stairs. If you do not have them, have them installed. What are some other fall prevention tips?  Wear closed-toe shoes that fit well and support your feet. Wear shoes that have rubber soles or low heels.  When you use a stepladder, make sure that it is completely opened and that the sides are firmly locked. Have someone hold the ladder while you are using it. Do not climb a closed stepladder.  Add color or contrast paint or tape to grab bars and handrails in your home. Place contrasting color strips on the first and last steps.  Use mobility aids as needed, such as canes, walkers, scooters, and crutches.  Turn on lights if it is dark. Replace any light bulbs that burn out.  Set up furniture so that there are clear paths. Keep the furniture in the same spot.  Fix any uneven floor surfaces.  Choose a carpet design that does not hide the edge of steps of a stairway.  Be aware of any and all pets.  Review your medicines with your healthcare provider. Some medicines can cause dizziness or changes in blood pressure, which increase your risk of falling. Talk with your health care provider about other ways that you can decrease your risk of falls. This may include working with a physical therapist or trainer to improve your strength, balance, and endurance. This information is not intended to replace advice given to you by your health care provider. Make sure you discuss any questions you have with your health care provider. Document Released: 01/04/2002 Document Revised: 06/13/2015 Document Reviewed: 02/18/2014 Elsevier Interactive Patient Education  2017 [place] [employer]     [place] K. Panosh M.D.

## 2016-10-01 ENCOUNTER — Telehealth: Payer: Self-pay | Admitting: Family Medicine

## 2016-10-01 DIAGNOSIS — Z13820 Encounter for screening for osteoporosis: Secondary | ICD-10-CM

## 2016-10-01 NOTE — Telephone Encounter (Signed)
Order faxed over to [facility]. Note added to fax for them to have patient sign a waiver form if her insurance will not cover it.

## 2016-10-01 NOTE — Telephone Encounter (Signed)
Order printed to be signed.

## 2016-10-01 NOTE — Telephone Encounter (Signed)
Patient would like bone dexa order sent to [facility].[place] [phone], Fax# [phone]

## 2016-10-15 ENCOUNTER — Encounter: Payer: Self-pay | Admitting: Family Medicine

## 2016-10-17 ENCOUNTER — Encounter: Payer: Self-pay | Admitting: Family Medicine

## 2016-10-24 ENCOUNTER — Other Ambulatory Visit: Payer: Self-pay | Admitting: Family Medicine

## 2016-10-24 DIAGNOSIS — E038 Other specified hypothyroidism: Secondary | ICD-10-CM

## 2017-04-18 DIAGNOSIS — Z1231 Encounter for screening mammogram for malignant neoplasm of breast: Secondary | ICD-10-CM | POA: Diagnosis not present

## 2017-04-18 LAB — HM MAMMOGRAPHY

## 2017-04-23 ENCOUNTER — Encounter: Payer: Self-pay | Admitting: Family Medicine

## 2017-04-24 ENCOUNTER — Telehealth: Payer: Self-pay | Admitting: Family Medicine

## 2017-04-24 NOTE — Telephone Encounter (Signed)
Copied from [place]. Topic: Quick Communication - See Telephone Encounter >> Apr 24, 2017 10:36 AM Boyd Kerbs wrote: CRM for notification.  Pt. Was returning call, message said to call back.  No CRM   Please call her back  (she said maybe be about mammogram)  May leave message on my chart too  See Telephone encounter for: 04/24/17.

## 2017-04-24 NOTE — Telephone Encounter (Signed)
Spoke with patient, informed her that I wasn't the one to call her, she stated it was a recording and that she was overdue for a physical. Informed patient of mammogram results and patient verbalized understanding. Patient stated that she would call office to schedule an appointment for her physical as soon as she was done with some of her other appointments.

## 2017-04-28 ENCOUNTER — Other Ambulatory Visit: Payer: Self-pay | Admitting: Family Medicine

## 2017-04-28 ENCOUNTER — Telehealth: Payer: Self-pay | Admitting: Family Medicine

## 2017-04-28 DIAGNOSIS — E038 Other specified hypothyroidism: Secondary | ICD-10-CM

## 2017-04-28 MED ORDER — THYROID 60 MG PO TABS: 60.0000 mg | ORAL_TABLET | Freq: Every day | ORAL | 0 refills | Status: DC

## 2017-04-28 NOTE — Telephone Encounter (Signed)
Patient has a physical scheduled for 05/06/17. She would like to know can she get enough medication to get her to the appt.

## 2017-04-28 NOTE — Telephone Encounter (Signed)
Request for refill for Armour Thyroid 60 mg tab.  Provider: Betty [name], MD  LOV  08/12/16  Last TSH  08/09/15  Pharmacy:  [facility]  Please advise.

## 2017-04-28 NOTE — Telephone Encounter (Signed)
Rx for Armour Thyroid 60 mg was sent to her pharmacy. Adriana Peters

## 2017-04-28 NOTE — Telephone Encounter (Unsigned)
Copied from [place]. Topic: Quick Communication - See Telephone Encounter >> Apr 28, 2017  7:05 AM Hewitt Shorts wrote: CRM for notification. See Telephone encounter for: 04/28/17.pt6 is needing a refill on her armour thyroid  [facility] [phone]  Best number for pt is [phone]

## 2017-04-30 ENCOUNTER — Encounter: Payer: Self-pay | Admitting: Family Medicine

## 2017-05-05 NOTE — Progress Notes (Signed)
HPI:   [name] is a 84 y.o. female, who is here today for her routine physical.  Last CPE: 2-3 years ago.  Regular exercise 3 or more time per week: Daily and active around the house. She takes care of a big garden. Following a healthy diet: Yes. She lives with her husband.    Independent ADL's and IADL's.    Chronic medical problems: Osteoporosis, chronic cervical pain, hypothyroidism, pure hypercholesterinemia on non pharmacologic treatment, and varicose vein disease and among some.   GERD: Vomiting 1-2 per week and aggravated by bending down.  She has had these symptoms for over a year, she has refused PPIs, concerned about side effects.  She brought an article today about PPI and osteoporosis.  Vomit contains digested food. No hematemesis.  In general she follows a healthy diet,she has not identified foods that aggravate problem.  She takes cider vinegar and baking soda, which help with GI symptoms.  Lower back pain, history of scoliosis.  Intermittent lower back pain, sometimes it is severe. She has no noted saddle anesthesia, associated urine/bowel incontinence. She uses homeopathic treatment, she does not take OTC analgesics.  She has follow-up with chiropractor, she did not feel like treatment was helping.  PT helped greatly with cervical pain.  Osteoporosis: She has not been interested in pharmacologic treatment. She is currently on calcium and vitamin D supplementation.  Hypothyroidism:  Currently she is on her Armour Thyroid 60 mg daily. She has been taking the medication for 40+ years. Tolerating medication well, no side effects reported. She has not noted dysphagia, palpitations, abdominal pain, changes in bowel habits, tremor, cold/heat intolerance, or abnormal weight loss.  Lab Results  Component Value Date   TSH 1.45 08/09/2015     Immunization History  Administered Date(s) Administered  . Pneumococcal Polysaccharide-23  01/28/2013  . Tdap 01/29/2011    Mammogram: 03/2017. DEXA: 09/2016.  Depression screen PHQ 2/9 05/06/2017  Decreased Interest 0  Down, Depressed, Hopeless 0  PHQ - 2 Score 0   Fall Risk  05/06/2017 02/08/2016  Falls in the past year? No Yes  Number falls in past yr: - 1  Injury with Fall? - No    She has no major concerns today. She would like blood work done.   Review of Systems  Constitutional: Negative for appetite change, fatigue and fever.  HENT: Negative for hearing loss, mouth sores, trouble swallowing and voice change.   Eyes: Negative for redness and visual disturbance.  Respiratory: Negative for cough, shortness of breath and wheezing.   Cardiovascular: Negative for chest pain and leg swelling.  Gastrointestinal: Positive for vomiting. Negative for abdominal pain and blood in stool.       No changes in bowel habits.  Endocrine: Negative for cold intolerance, heat intolerance, polydipsia, polyphagia and polyuria.  Genitourinary: Negative for decreased urine volume, dysuria and hematuria.  Musculoskeletal: Positive for arthralgias and back pain.  Skin: Negative for color change and rash.  Neurological: Negative for syncope, weakness and headaches.  Psychiatric/Behavioral: Negative for confusion and sleep disturbance.  All other systems reviewed and are negative.     Current Outpatient Medications on File Prior to Visit  Medication Sig Dispense Refill  . thyroid (ARMOUR THYROID) 60 MG tablet Take 1 tablet (60 mg total) by mouth daily with breakfast. 30 tablet 0   No current facility-administered medications on file prior to visit.      Past Medical History:  Diagnosis Date  . Arthritis   .  GERD (gastroesophageal reflux disease)   . Osteoporosis   . Thyroid disease    Hypothyroidism.    History reviewed. No pertinent surgical history.  No Known Allergies  Family History  Problem Relation Age of Onset  . Diabetes Neg Hx   . Cancer Neg Hx     Social  History   Socioeconomic History  . Marital status: Married    Spouse name: Not on file  . Number of children: Not on file  . Years of education: Not on file  . Highest education level: Not on file  Occupational History  . Not on file  Social Needs  . Financial resource strain: Not on file  . Food insecurity:    Worry: Not on file    Inability: Not on file  . Transportation needs:    Medical: Not on file    Non-medical: Not on file  Tobacco Use  . Smoking status: Never Smoker  . Smokeless tobacco: Never Used  Substance and Sexual Activity  . Alcohol use: Yes    Comment: Wine  . Drug use: No  . Sexual activity: Not Currently  Lifestyle  . Physical activity:    Days per week: Not on file    Minutes per session: Not on file  . Stress: Not on file  Relationships  . Social connections:    Talks on phone: Not on file    Gets together: Not on file    Attends religious service: Not on file    Active member of club or organization: Not on file    Attends meetings of clubs or organizations: Not on file    Relationship status: Not on file  Other Topics Concern  . Not on file  Social History Narrative  . Not on file     Vitals:   05/06/17 0926  BP: 120/78  Pulse: 79  Resp: 16  Temp: 98.3 F (36.8 C)  SpO2: 100%   Body mass index is 20.77 kg/m.   Wt Readings from Last 3 Encounters:  05/06/17 106 lb 6 oz (48.3 kg)  08/12/16 105 lb 12.8 oz (48 kg)  06/04/16 105 lb 4 oz (47.7 kg)     Physical Exam  Nursing note and vitals reviewed. Constitutional: She is oriented to person, place, and time. She appears well-developed and well-nourished. No distress.  HENT:  Head: Normocephalic and atraumatic.  Right Ear: Hearing, tympanic membrane, external ear and ear canal normal.  Left Ear: Hearing, tympanic membrane, external ear and ear canal normal.  Mouth/Throat: Uvula is midline, oropharynx is clear and moist and mucous membranes are normal.  Eyes: Pupils are equal,  round, and reactive to light. Conjunctivae and EOM are normal.  Neck: No tracheal deviation present. No thyromegaly present.  Cardiovascular: Normal rate and regular rhythm.  No murmur heard. Pulses:      Dorsalis pedis pulses are 2+ on the right side, and 2+ on the left side.  Respiratory: Effort normal and breath sounds normal. No respiratory distress.  GI: Soft. She exhibits no mass. There is no hepatomegaly. There is no tenderness.  Musculoskeletal: She exhibits no edema.  Scoliosis present. No signs of synovitis..  Lymphadenopathy:    She has no cervical adenopathy.  Neurological: She is alert and oriented to person, place, and time. She has normal strength. No cranial nerve deficit. Coordination normal.  Reflex Scores:      Patellar reflexes are 2+ on the right side and 2+ on the left side. Otherwise stable gait,  not assisted.  Skin: Skin is warm. No rash noted. No erythema.  Psychiatric: She has a normal mood and affect. Her speech is normal.  Well groomed, good eye contact.    ASSESSMENT AND PLAN:  [name] was here today annual physical examination.   Orders Placed This Encounter  Procedures  . Basic metabolic panel  . Lipid panel  . TSH   Lab Results  Component Value Date   TSH 1.37 05/06/2017   Lab Results  Component Value Date   CHOL 191 05/06/2017   HDL 78.20 05/06/2017   LDLCALC 102 (H) 05/06/2017   TRIG 56.0 05/06/2017   CHOLHDL 2 05/06/2017   Lab Results  Component Value Date   CREATININE 0.66 05/06/2017   BUN 14 05/06/2017   NA 139 05/06/2017   K 4.4 05/06/2017   CL 103 05/06/2017   CO2 30 05/06/2017    Routine general medical examination at a health care facility  We discussed the importance of regular physical activity and healthy diet for prevention of chronic illness and/or complications. Preventive guidelines reviewed. Vaccination up-to-date. Fall precautions discussed. Ca++ and vit D supplementation to continue. Next CPE  in a year.  Hiatal hernia with GERD She is not interested in PPI medication, we discussed some side effects as well as benefits vs risk and complications from persistent esophageal irritation. She would like to try OTC Zantac, some side effects discussed. GERD precautions to continue.  Hypothyroidism No changes in current management, will follow labs done today and will give further recommendations accordingly. Follow-up in 6-12 months.  Pure hypercholesterolemia Mild. Continue nonpharmacologic treatment. Further recommendations will be given according to lab results.     Return in 1 year (on 05/07/2018) for routine.          [name]. [name], MD  [facility] [place] office.

## 2017-05-06 ENCOUNTER — Ambulatory Visit (INDEPENDENT_AMBULATORY_CARE_PROVIDER_SITE_OTHER): Payer: Medicare HMO | Admitting: Family Medicine

## 2017-05-06 ENCOUNTER — Encounter: Payer: Self-pay | Admitting: Family Medicine

## 2017-05-06 VITALS — BP 120/78 | HR 79 | Temp 98.3°F | Resp 16 | Ht 60.0 in | Wt 106.4 lb

## 2017-05-06 DIAGNOSIS — E7800 Pure hypercholesterolemia, unspecified: Secondary | ICD-10-CM

## 2017-05-06 DIAGNOSIS — Z0001 Encounter for general adult medical examination with abnormal findings: Secondary | ICD-10-CM | POA: Diagnosis not present

## 2017-05-06 DIAGNOSIS — Z0000 Encounter for general adult medical examination without abnormal findings: Secondary | ICD-10-CM

## 2017-05-06 DIAGNOSIS — K219 Gastro-esophageal reflux disease without esophagitis: Secondary | ICD-10-CM

## 2017-05-06 DIAGNOSIS — E038 Other specified hypothyroidism: Secondary | ICD-10-CM | POA: Diagnosis not present

## 2017-05-06 DIAGNOSIS — K449 Diaphragmatic hernia without obstruction or gangrene: Secondary | ICD-10-CM | POA: Diagnosis not present

## 2017-05-06 LAB — BASIC METABOLIC PANEL
BUN: 14 mg/dL (ref 6–23)
CALCIUM: 9.2 mg/dL (ref 8.4–10.5)
CO2: 30 mEq/L (ref 19–32)
CREATININE: 0.66 mg/dL (ref 0.40–1.20)
Chloride: 103 mEq/L (ref 96–112)
GFR: 90.12 mL/min (ref >60.00)
Glucose, Bld: 95 mg/dL (ref 70–99)
POTASSIUM: 4.4 meq/L (ref 3.5–5.1)
SODIUM: 139 meq/L (ref 135–145)

## 2017-05-06 LAB — LIPID PANEL
CHOLESTEROL: 191 mg/dL (ref 0–200)
HDL: 78.2 mg/dL (ref >39.00)
LDL Cholesterol: 102 mg/dL — ABNORMAL HIGH (ref 0–99)
NonHDL: 113.12
TRIGLYCERIDES: 56 mg/dL (ref 0.0–149.0)
Total CHOL/HDL Ratio: 2
VLDL: 11.2 mg/dL (ref 0.0–40.0)

## 2017-05-06 LAB — TSH: TSH: 1.37 u[IU]/mL (ref 0.35–4.50)

## 2017-05-06 NOTE — Assessment & Plan Note (Signed)
She is not interested in PPI medication, we discussed some side effects as well as benefits vs risk and complications from persistent esophageal irritation. She would like to try OTC Zantac, some side effects discussed. GERD precautions to continue.

## 2017-05-06 NOTE — Assessment & Plan Note (Signed)
Mild. Continue nonpharmacologic treatment. Further recommendations will be given according to lab results.

## 2017-05-06 NOTE — Assessment & Plan Note (Signed)
No changes in current management, will follow labs done today and will give further recommendations accordingly. Follow-up in 6-12 months. 

## 2017-05-06 NOTE — Patient Instructions (Signed)
A few things to remember from today's visit:   Routine general medical examination at a health care facility  Other specified hypothyroidism - Plan: Basic metabolic panel, TSH  Pure hypercholesterolemia - Plan: Lipid panel  A few tips:  -As we age balance is not as good as it was, so there is a higher risks for falls. Please remove small rugs and furniture that is "in your way" and could increase the risk of falls. Stretching exercises may help with fall prevention: Yoga and Tai Chi are some examples. Low impact exercise is better, so you are not very achy the next day.  -Sun screen and avoidance of direct sun light recommended. Caution with dehydration, if working outdoors be sure to drink enough fluids.  - Some medications are not safe as we age, increases the risk of side effects and can potentially interact with other medication you are also taken;  including some of over the counter medications. Be sure to let me know when you start a new medication even if it is a dietary/vitamin supplement.   -Healthy diet low in red meet/animal fat and sugar + regular physical activity is recommended.      Please be sure medication list is accurate. If a new problem present, please set up appointment sooner than planned today.

## 2017-05-10 ENCOUNTER — Encounter: Payer: Self-pay | Admitting: Family Medicine

## 2017-05-10 MED ORDER — THYROID 60 MG PO TABS: 60.0000 mg | ORAL_TABLET | Freq: Every day | ORAL | 3 refills | Status: DC

## 2017-05-13 DIAGNOSIS — H35372 Puckering of macula, left eye: Secondary | ICD-10-CM | POA: Diagnosis not present

## 2017-09-30 DIAGNOSIS — C44311 Basal cell carcinoma of skin of nose: Secondary | ICD-10-CM | POA: Diagnosis not present

## 2017-10-21 ENCOUNTER — Telehealth: Payer: Self-pay | Admitting: Family Medicine

## 2017-10-21 NOTE — Telephone Encounter (Signed)
Is it okay to place order for Dexa Scan? Please advise.

## 2017-10-21 NOTE — Telephone Encounter (Signed)
Patient needs orders for a Dexa Scan.  She states she gets it done in [facility] [phone]).  She wants a call back so she will know what she needs to do.

## 2017-10-22 NOTE — Telephone Encounter (Signed)
It is ok to place DEXA. Dx asymptomatic estrogen def. Thanks, BJ

## 2017-10-23 ENCOUNTER — Other Ambulatory Visit: Payer: Self-pay | Admitting: *Deleted

## 2017-10-23 DIAGNOSIS — Z780 Asymptomatic menopausal state: Secondary | ICD-10-CM

## 2017-10-23 NOTE — Telephone Encounter (Signed)
Order placed.  Left message informing patient.  

## 2017-10-29 ENCOUNTER — Encounter: Payer: Self-pay | Admitting: Family Medicine

## 2017-11-14 DIAGNOSIS — M8588 Other specified disorders of bone density and structure, other site: Secondary | ICD-10-CM | POA: Diagnosis not present

## 2017-11-14 DIAGNOSIS — M810 Age-related osteoporosis without current pathological fracture: Secondary | ICD-10-CM | POA: Diagnosis not present

## 2017-11-14 DIAGNOSIS — Z780 Asymptomatic menopausal state: Secondary | ICD-10-CM | POA: Diagnosis not present

## 2017-11-25 ENCOUNTER — Telehealth: Payer: Self-pay | Admitting: Family Medicine

## 2017-11-25 NOTE — Telephone Encounter (Signed)
Pt dropped off a DMV placard form to be filled out by the provider she state that she has spinal stenosis and that Dr. [name] knows about it .  Upon completion she would like to be called [phone] to pick the form up.   Placed form in providers folder.

## 2017-11-28 NOTE — Telephone Encounter (Signed)
Form placed on doctor's desk for completion. 

## 2017-12-01 NOTE — Telephone Encounter (Signed)
Form placed at front desk for pick-up. Patient informed.

## 2017-12-18 DIAGNOSIS — C44311 Basal cell carcinoma of skin of nose: Secondary | ICD-10-CM | POA: Diagnosis not present

## 2018-02-23 IMAGING — DX DG RIBS 2V*L*
2 series · 2 of 2 positions shown · non-contrast
Comparison: 02/12/2016.

CLINICAL DATA: Fall 3 days ago with left-sided chest pain, initial
encounter

EXAM:
LEFT RIBS - 2 VIEW

[rib pa]
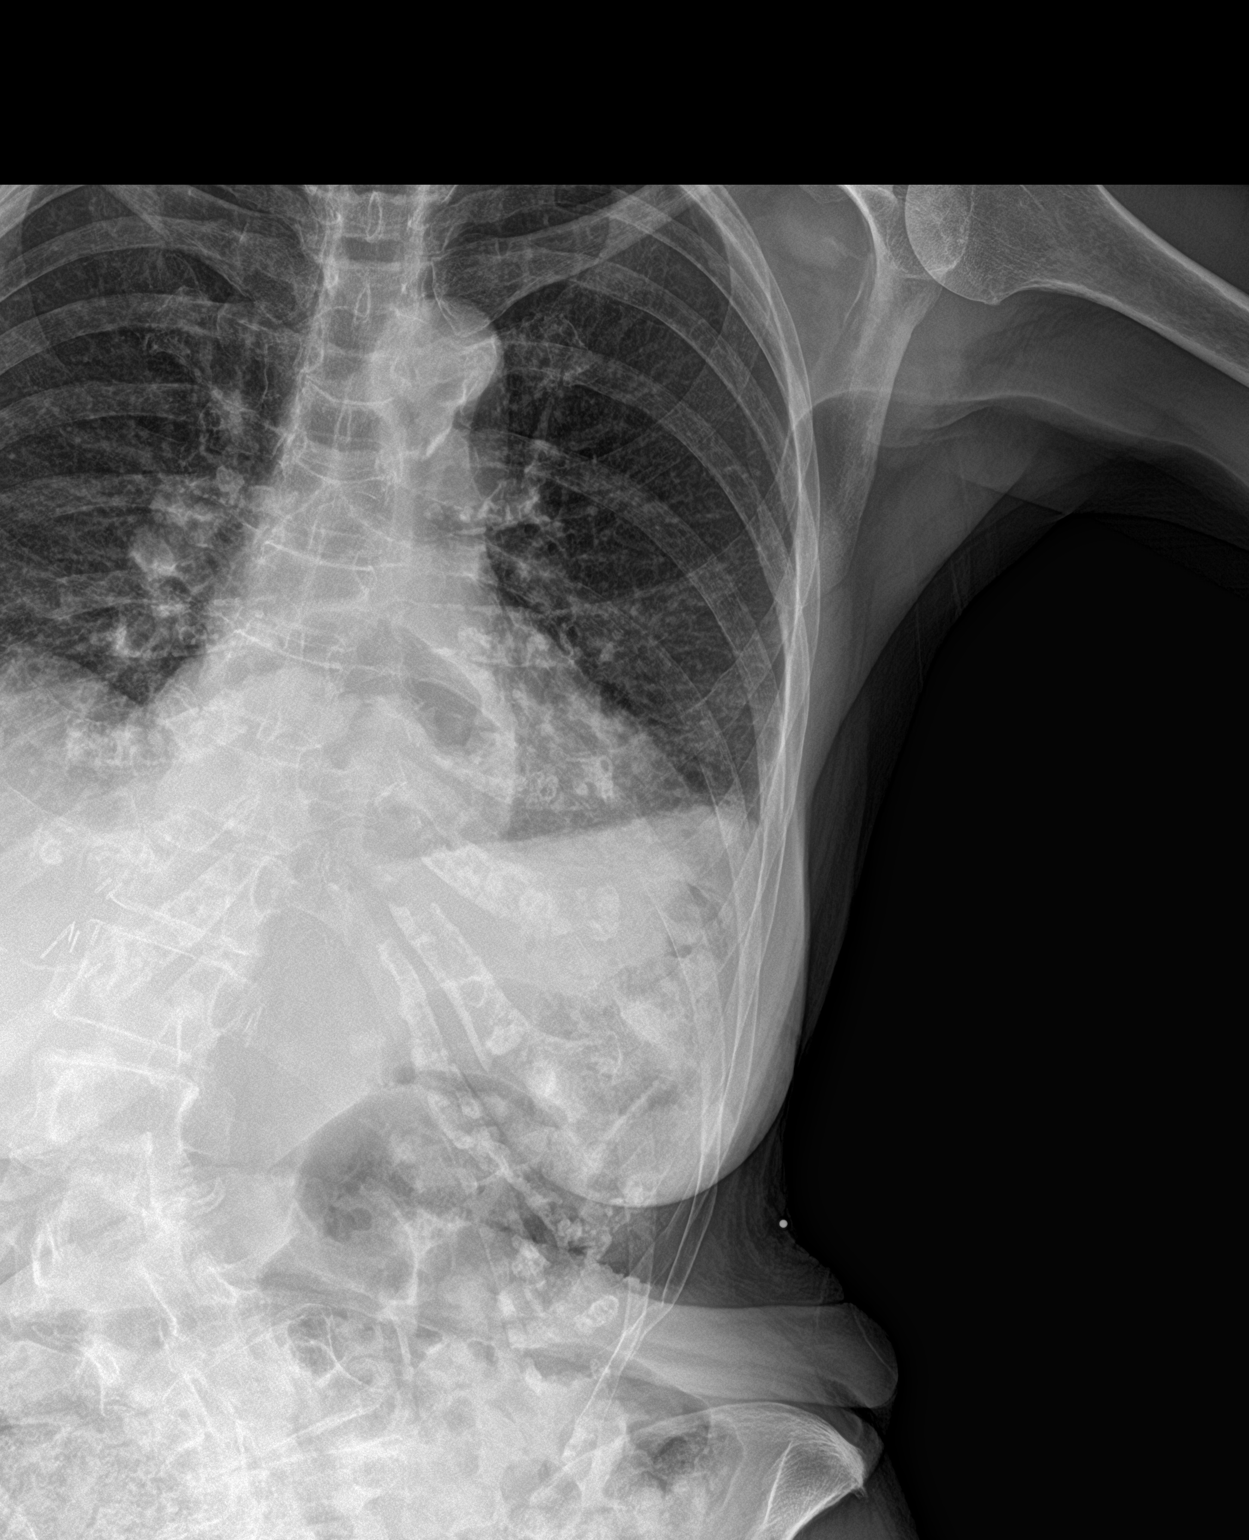

[rib obl]
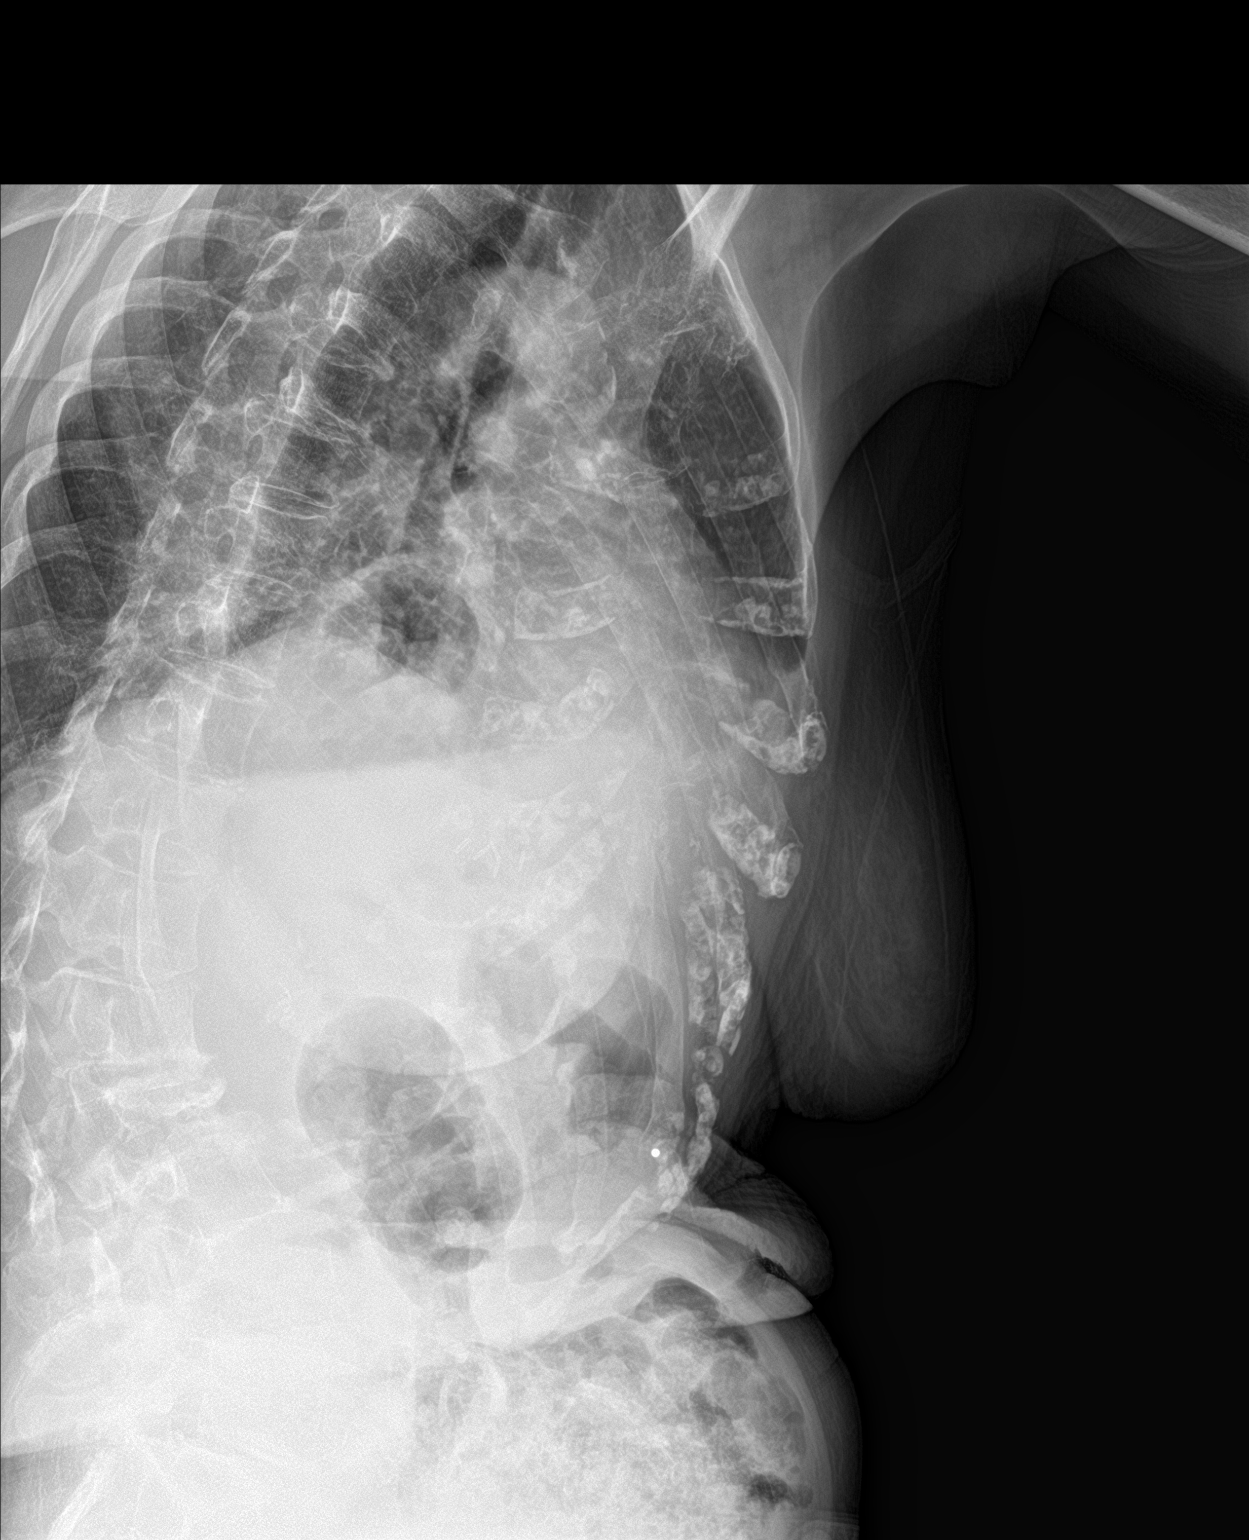

[2 of 2 positions shown; findings below may reference images not displayed]

FINDINGS: Cardiac shadow is mildly enlarged. Minimally displaced fracture of
the left ninth rib is noted anterior laterally. No other definitive
fractures are seen. No underlying pneumothorax is noted. Large
hiatal hernia is noted.
IMPRESSION: Minimally displaced left ninth rib fracture. No complicating factors
are seen.

Large hiatal hernia stable from previous exam.

## 2018-05-17 ENCOUNTER — Other Ambulatory Visit: Payer: Self-pay | Admitting: Family Medicine

## 2018-05-17 DIAGNOSIS — E038 Other specified hypothyroidism: Secondary | ICD-10-CM

## 2018-05-18 NOTE — Telephone Encounter (Signed)
Left message to schedule virtual visit for med refills.

## 2018-05-19 NOTE — Telephone Encounter (Signed)
Left message to return call to clinic to schedule virtual visit for med refills.

## 2018-05-20 ENCOUNTER — Other Ambulatory Visit: Payer: Self-pay

## 2018-05-20 ENCOUNTER — Ambulatory Visit (INDEPENDENT_AMBULATORY_CARE_PROVIDER_SITE_OTHER): Payer: Medicare HMO | Admitting: Family Medicine

## 2018-05-20 ENCOUNTER — Encounter: Payer: Self-pay | Admitting: Family Medicine

## 2018-05-20 DIAGNOSIS — E038 Other specified hypothyroidism: Secondary | ICD-10-CM | POA: Diagnosis not present

## 2018-05-20 DIAGNOSIS — E039 Hypothyroidism, unspecified: Secondary | ICD-10-CM | POA: Diagnosis not present

## 2018-05-20 DIAGNOSIS — K449 Diaphragmatic hernia without obstruction or gangrene: Secondary | ICD-10-CM

## 2018-05-20 DIAGNOSIS — K219 Gastro-esophageal reflux disease without esophagitis: Secondary | ICD-10-CM

## 2018-05-20 DIAGNOSIS — M810 Age-related osteoporosis without current pathological fracture: Secondary | ICD-10-CM | POA: Diagnosis not present

## 2018-05-20 MED ORDER — THYROID 60 MG PO TABS: 60.0000 mg | ORAL_TABLET | Freq: Every day | ORAL | 0 refills | Status: DC

## 2018-05-20 NOTE — Assessment & Plan Note (Signed)
Still symptomatic. She is not interested in pharmacologic treatment. GERD precautions to continue.

## 2018-05-20 NOTE — Assessment & Plan Note (Signed)
TSH has been in norma range. Because COVID 19 pandemia we are opting for having labs next OV. No changes in current management.

## 2018-05-20 NOTE — Assessment & Plan Note (Signed)
She has refused pharmacologic treatment. She is still exercising weekly,special program that is supposed tp help with problem. Fall precautions. Continue Ca++ and vit D supplementation.

## 2018-05-20 NOTE — Progress Notes (Signed)
Virtual Visit via Telephone Note  I connected with Adriana Peters on 05/20/18 at 12:00 PM EDT by telephone and verified that I am speaking with the correct person using two identifiers.   I discussed the limitations, risks, security and privacy concerns of performing an evaluation and management service by telephone and the availability of in person appointments. I also discussed with the patient that there may be a patient responsible charge related to this service. The patient expressed understanding and agreed to proceed. Video visit (doxy.me and WebEx failed).  Location patient: home Location provider: home office Participants present for the call: patient, provider Patient did not have a visit in the prior 7 days to address this/these issue(s).   History of Present Illness: Ms Adriana Peters is a 85 yo with Hx of generalized OA,cervicalgia,GERD,Raynoud phenomenon,LE venous disease,and hypothyroidism among some. Since her last OV she has followed with dermatologist  Hypothyroidism: Dx 42+ years ago. Currently she is on Armour Thyroid 60 mg daily.. Tolerating medication well, no side effects reported. She has not noted dysphagia, palpitations, abdominal pain, changes in bowel habits, tremor, cold/heat intolerance, or abnormal weight loss.  Lab Results  Component Value Date   TSH 1.37 05/06/2017   Osteoporosis,she is not interested in pharmacologic treatment. Once per week she has an exercise class to help with bone density. She takes Ca++ and vit D. No falls in the past year.  DEXA 10/2017 osteoporosis right femoral neck.   GERD: She is on nonpharmacologic treatment. She was on omeprazole a few years ago. Still having acid reflux.  2 weeks. She tries to avoid foods that can trigger symptoms. Occasionally she has dysphagia for solids, problem has been stable for years. + Nausea. Denies abdominal pain,vomiting, changes in bowel habits, blood in stool or melena.  She takes home  remedies, vinegar.  COVID-19 screening questions: Denies new fever,cough,sore throat,or possible exposure to COVID-19. Negative for loss in the sense of smell or taste.  Observations/Objective: Patient sounds cheerful and well on the phone. I do not appreciate any SOB. Speech and thought processing are grossly intact. Patient reported vitals:N/A  Assessment and Plan:  Hypothyroidism TSH has been in norma range. Because COVID 19 pandemia we are opting for having labs next OV. No changes in current management.  Hiatal hernia with GERD Still symptomatic. She is not interested in pharmacologic treatment. GERD precautions to continue.   Osteoporosis She has refused pharmacologic treatment. She is still exercising weekly,special program that is supposed tp help with problem. Fall precautions. Continue Ca++ and vit D supplementation.   Follow Up Instructions:  Fall precautions. We are opting to hold on lab work until it is safer to go to the clinic. CPE next OV.  I did not refer this patient for an OV in the next 24 hours for this/these issue(s).  I discussed the assessment and treatment plan with the patient. She was provided an opportunity to ask questions and all were answered. The patient agreed with the plan and demonstrated an understanding of the instructions.  Return in about 8 weeks (around 07/15/2018) for CPE/medicare.  I provided 21 minutes of non-face-to-face time during this encounter.   Adriana Peters [name], MD

## 2018-08-27 ENCOUNTER — Other Ambulatory Visit: Payer: Self-pay | Admitting: Family Medicine

## 2018-08-27 DIAGNOSIS — E038 Other specified hypothyroidism: Secondary | ICD-10-CM

## 2018-08-31 NOTE — Telephone Encounter (Signed)
Patient is waiting for refill on Armour Thyroid 60 mg tablet to be called in  She has been trying since last week for it to be called in. Patient is out of medication.  [facility] [place]

## 2018-08-31 NOTE — Telephone Encounter (Signed)
Pt called stating she took her last pill this morning. Please advise.

## 2018-09-15 ENCOUNTER — Ambulatory Visit (INDEPENDENT_AMBULATORY_CARE_PROVIDER_SITE_OTHER): Payer: Medicare HMO | Admitting: Family Medicine

## 2018-09-15 ENCOUNTER — Other Ambulatory Visit: Payer: Self-pay

## 2018-09-15 ENCOUNTER — Encounter: Payer: Self-pay | Admitting: Family Medicine

## 2018-09-15 VITALS — BP 120/80 | HR 70 | Temp 98.1°F | Ht 59.0 in | Wt 104.2 lb

## 2018-09-15 DIAGNOSIS — E7800 Pure hypercholesterolemia, unspecified: Secondary | ICD-10-CM | POA: Diagnosis not present

## 2018-09-15 DIAGNOSIS — M810 Age-related osteoporosis without current pathological fracture: Secondary | ICD-10-CM | POA: Diagnosis not present

## 2018-09-15 DIAGNOSIS — K449 Diaphragmatic hernia without obstruction or gangrene: Secondary | ICD-10-CM | POA: Diagnosis not present

## 2018-09-15 DIAGNOSIS — K219 Gastro-esophageal reflux disease without esophagitis: Secondary | ICD-10-CM

## 2018-09-15 DIAGNOSIS — Z0000 Encounter for general adult medical examination without abnormal findings: Secondary | ICD-10-CM

## 2018-09-15 DIAGNOSIS — E039 Hypothyroidism, unspecified: Secondary | ICD-10-CM | POA: Diagnosis not present

## 2018-09-15 LAB — TSH: TSH: 1.15 u[IU]/mL (ref 0.35–4.50)

## 2018-09-15 LAB — BASIC METABOLIC PANEL
BUN: 17 mg/dL (ref 6–23)
CO2: 28 mEq/L (ref 19–32)
Calcium: 9.4 mg/dL (ref 8.4–10.5)
Chloride: 102 mEq/L (ref 96–112)
Creatinine, Ser: 0.67 mg/dL (ref 0.40–1.20)
GFR: 83.07 mL/min (ref >60.00)
Glucose, Bld: 94 mg/dL (ref 70–99)
Potassium: 4.1 mEq/L (ref 3.5–5.1)
Sodium: 139 mEq/L (ref 135–145)

## 2018-09-15 NOTE — Assessment & Plan Note (Signed)
She is not interested in pharmacologic treatment. Continue regular physical activity and osteo-strong treatment. Fall precaution.

## 2018-09-15 NOTE — Assessment & Plan Note (Signed)
Still having some episodes of acid reflux. She is not interested in PPI. Continue GERD precautions.

## 2018-09-15 NOTE — Assessment & Plan Note (Signed)
She prefers to hold on labs. Continue low-fat diet.

## 2018-09-15 NOTE — Progress Notes (Signed)
HPI:   Adriana Peters is a 85 y.o. female, who is here today for Adriana Peters routine physical and AWV.  Last CPE: 05/06/17.  Regular exercise 3 or more time per week: osteo strong once per week. It has helped with gait stability. Following a healthy diet: Yes. Adriana Peters lives with Adriana Peters husband. Chronic medical problems: BCC, osteoporosis, hyperlipidemia, and GERD among some.  GERD,Adriana Peters has refused pharmacologic treatment. Adriana Peters is having less episodes of acid reflux and vomiting. Adriana Peters has not identified exacerbating factors.  Osteoporosis: Adriana Peters has refused medication. Adriana Peters does osteo strong exercises once per week.  Hypothyroidism: Adriana Peters is on Armour thyroid 60 mg daily. Adriana Peters has felt cold lately,Adriana Peters wonders if Adriana Peters medication needs to be adjusted.   Immunization History  Administered Date(s) Administered  . Influenza, High Dose Seasonal PF 11/19/2013, 11/01/2016, 10/28/2017  . Influenza-Unspecified 10/10/2016  . Pneumococcal Polysaccharide-23 02/18/2013  . Tdap 01/29/2011    Mammogram: 04/18/2017. DEXA: 11/14/17.  Adriana Peters has no concerns today.  Independent ADL's and IADL's. a fall in Adriana past year, trying to run from a copper head snake. Denies depression symptoms.  Functional Status Survey: Is Adriana Peters deaf or have difficulty hearing?: No Does Adriana Peters have difficulty seeing, even when wearing glasses/contacts?: No Does Adriana Peters have difficulty concentrating, remembering, or making decisions?: No Does Adriana Peters have difficulty walking or climbing stairs?: No Does Adriana Peters have difficulty dressing or bathing?: No Does Adriana Peters have difficulty doing errands alone such as visiting a doctor's office or shopping?: No  Fall Risk  09/15/2018 05/06/2017 02/08/2016  Falls in Adriana past year? 1 No Yes  Number falls in past yr: 0 - 1  Injury with Fall? 0 - No  Risk for fall due to : History of fall(s) - -  Follow up Falls evaluation completed - -   Providers Adriana Peters sees  regularly:  Eye care provider: Dr [place] Dermatologist: Monia Sabal, annually.  Depression screen [facility] 2/9 09/15/2018  Decreased Interest 0  Down, Depressed, Hopeless 0  PHQ - 2 Score 0   Mini-Cog - 09/15/18 0915    How many words correct?  2       Hearing Screening   125Hz  250Hz  500Hz  1000Hz  2000Hz  3000Hz  4000Hz  6000Hz  8000Hz   Right ear:           Left ear:             Visual Acuity Screening   Right eye Left eye Both eyes  Without correction: 20/40 20/200 20/40  With correction:       Review of Systems  Constitutional: Negative for activity change, appetite change, fatigue and fever.  HENT: Negative for mouth sores, nosebleeds, sore throat and trouble swallowing.   Eyes: Negative for redness and visual disturbance.  Respiratory: Negative for cough, shortness of breath and wheezing.   Cardiovascular: Negative for chest pain, palpitations and leg swelling.  Gastrointestinal: Negative for abdominal pain.       Negative for changes in bowel habits.  Genitourinary: Negative for decreased urine volume, difficulty urinating, dysuria and hematuria.  Musculoskeletal: Positive for neck pain. Negative for myalgias.  Neurological: Negative for syncope, weakness and headaches.  Psychiatric/Behavioral: Negative for confusion. Adriana Peters is nervous/anxious.   All other systems reviewed and are negative.   Current Outpatient Medications on File Prior to Visit  Medication Sig Dispense Refill  . ARMOUR THYROID 60 MG tablet TAKE 1 TABLET (60 MG TOTAL) BY MOUTH DAILY BEFORE BREAKFAST. 90 tablet 0  No current facility-administered medications on file prior to visit.      Past Medical History:  Diagnosis Date  . Arthritis   . GERD (gastroesophageal reflux disease)   . Osteoporosis   . Thyroid disease    Hypothyroidism.    History reviewed. No pertinent surgical history.  No Known Allergies  Family History  Problem Relation Age of Onset  . Diabetes Neg Hx   . Cancer Neg Hx      Social History   Socioeconomic History  . Marital status: Married    Spouse name: Not on file  . Number of children: Not on file  . Years of education: Not on file  . Highest education level: Not on file  Occupational History  . Not on file  Social Needs  . Financial resource strain: Not on file  . Food insecurity    Worry: Not on file    Inability: Not on file  . Transportation needs    Medical: Not on file    Non-medical: Not on file  Tobacco Use  . Smoking status: Never Smoker  . Smokeless tobacco: Never Used  Substance and Sexual Activity  . Alcohol use: Yes    Comment: Wine  . Drug use: No  . Sexual activity: Not Currently  Lifestyle  . Physical activity    Days per week: Not on file    Minutes per session: Not on file  . Stress: Not on file  Relationships  . Social Herbalist on phone: Not on file    Gets together: Not on file    Attends religious service: Not on file    Active member of club or organization: Not on file    Attends meetings of clubs or organizations: Not on file    Relationship status: Not on file  Other Topics Concern  . Not on file  Social History Narrative  . Not on file    Vitals:   09/15/18 0822  BP: 120/80  Pulse: 70  Temp: 98.1 F (36.7 C)  SpO2: 99%   Body mass index is 21.05 kg/m.   Wt Readings from Last 3 Encounters:  09/15/18 104 lb 3.2 oz (47.3 kg)  05/06/17 106 lb 6 oz (48.3 kg)  08/12/16 105 lb 12.8 oz (48 kg)    Physical Exam  Nursing note and vitals reviewed. Constitutional: Adriana Peters is oriented to person, place, and time. Adriana Peters appears well-developed. No distress.  HENT:  Head: Normocephalic and atraumatic.  Right Ear: Hearing, tympanic membrane, external ear and ear canal normal.  Left Ear: Hearing, tympanic membrane, external ear and ear canal normal.  Mouth/Throat: Uvula is midline, oropharynx is clear and moist and mucous membranes are normal.  Eyes: Pupils are equal, round, and reactive to  light. Conjunctivae and EOM are normal.  Neck: No tracheal deviation present. No thyromegaly present.  Cardiovascular: Normal rate and regular rhythm.  No murmur heard. Pulses:      Dorsalis pedis pulses are 2+ on Adriana right side and 2+ on Adriana left side.  Respiratory: Effort normal and breath sounds normal. No respiratory distress.  GI: Soft. Adriana Peters exhibits no mass. There is no hepatomegaly. There is no abdominal tenderness.  Genitourinary:    Genitourinary Comments: Deferred to gyn.   Musculoskeletal:        General: No edema.     Comments: No major deformity or signs of synovitis appreciated.  Lymphadenopathy:    Adriana Peters has no cervical adenopathy.  Right: No supraclavicular adenopathy present.       Left: No supraclavicular adenopathy present.  Neurological: Adriana Peters is alert and oriented to person, place, and time. Adriana Peters has normal strength. No cranial nerve deficit. Coordination and gait normal.  Reflex Scores:      Bicep reflexes are 2+ on Adriana right side and 2+ on Adriana left side.      Patellar reflexes are 2+ on Adriana right side and 2+ on Adriana left side. Skin: Skin is warm. No rash noted. No erythema.  Psychiatric: Adriana Peters has a normal mood and affect. Cognition and memory are normal.  Well groomed, good eye contact.    ASSESSMENT AND PLAN:  Adriana Peters.   Orders Placed This Encounter  Procedures  . Basic metabolic panel  . TSH   Lab Results  Component Value Date   TSH 1.15 09/15/2018   Lab Results  Component Value Date   CREATININE 0.67 09/15/2018   BUN 17 09/15/2018   NA 139 09/15/2018   K 4.1 09/15/2018   CL 102 09/15/2018   CO2 28 09/15/2018    Routine general medical Peters at a health care facility Preventive guidelines reviewed. Ca++ and vit D supplementation continue. Next CPE in a year.  Medicare annual wellness visit, subsequent We discussed Adriana importance of staying active, physically and mentally, as well  as Adriana benefits of a healthy/balance diet. Low impact exercise that involve stretching and strengthing are ideal. Vaccines up to date. Influenza vaccine annually recommended.  We discussed preventive screening for Adriana next 5-10 years, summery of recommendations given in AVS. Fall prevention.  Advance directives and end of life discussed, Adriana Peters does not have POA or living will, strongly recommended. Information given on AVS.   Pure hypercholesterolemia Adriana Peters prefers to hold on labs. Continue low-fat diet.   Hypothyroidism Continue thyroid Armour 60 mg daily.  We will follow TSH done today and adjust dose if needed.  Hiatal hernia with GERD Still having some episodes of acid reflux. Adriana Peters is not interested in PPI. Continue GERD precautions.  Osteoporosis Adriana Peters is not interested in pharmacologic treatment. Continue regular physical activity and osteo-strong treatment. Fall precaution.   Return in 1 year (on 09/15/2019) for cpe/awv.   Olena Willy G. [name], MD  [facility]. [place] office.

## 2018-09-15 NOTE — Assessment & Plan Note (Signed)
Continue thyroid Armour 60 mg daily.  We will follow TSH done today and adjust dose if needed.

## 2018-09-15 NOTE — Patient Instructions (Signed)
  Ms. Moman , Thank you for taking time to come for your Medicare Wellness Visit. I appreciate your ongoing commitment to your health goals. Please review the following plan we discussed and let me know if I can assist you in the future.   These are the goals we discussed: Goals   None     This is a list of the screening recommended for you and due dates:  Health Maintenance  Topic Date Due  . Flu Shot  08/29/2018  . Tetanus Vaccine  01/28/2021  . DEXA scan (bone density measurement)  Completed  . Pneumonia vaccines  Completed    A few tips:  -As we age balance is not as good as it was, so there is a higher risks for falls. Please remove small rugs and furniture that is "in your way" and could increase the risk of falls. Stretching exercises may help with fall prevention: Yoga and Tai Chi are some examples. Low impact exercise is better, so you are not very achy the next day.  -Sun screen and avoidance of direct sun light recommended. Caution with dehydration, if working outdoors be sure to drink enough fluids.  - Some medications are not safe as we age, increases the risk of side effects and can potentially interact with other medication you are also taken;  including some of over the counter medications. Be sure to let me know when you start a new medication even if it is a dietary/vitamin supplement.   -Healthy diet low in red meet/animal fat and sugar + regular physical activity is recommended.   Glaucoma screening/eye exam every 1-2 years.  Mammogram for breast cancer screening annually.  Flu vaccine annually.  Advance directives:  Please see a lawyer and/or go to this website to help you with advanced directives and designating a health care power of attorney so that your wishes will be followed should you become too ill to make your own medical decisions.  [email or web address]

## 2018-09-18 ENCOUNTER — Encounter: Payer: Self-pay | Admitting: Family Medicine

## 2018-10-13 DIAGNOSIS — D3131 Benign neoplasm of right choroid: Secondary | ICD-10-CM | POA: Diagnosis not present

## 2018-11-23 ENCOUNTER — Other Ambulatory Visit: Payer: Self-pay | Admitting: Family Medicine

## 2018-11-23 DIAGNOSIS — E038 Other specified hypothyroidism: Secondary | ICD-10-CM

## 2019-02-22 ENCOUNTER — Other Ambulatory Visit: Payer: Self-pay | Admitting: Family Medicine

## 2019-02-22 DIAGNOSIS — E038 Other specified hypothyroidism: Secondary | ICD-10-CM

## 2019-04-02 DIAGNOSIS — D225 Melanocytic nevi of trunk: Secondary | ICD-10-CM | POA: Diagnosis not present

## 2019-04-02 DIAGNOSIS — Z85828 Personal history of other malignant neoplasm of skin: Secondary | ICD-10-CM | POA: Diagnosis not present

## 2019-04-02 DIAGNOSIS — D1801 Hemangioma of skin and subcutaneous tissue: Secondary | ICD-10-CM | POA: Diagnosis not present

## 2019-04-02 DIAGNOSIS — R238 Other skin changes: Secondary | ICD-10-CM | POA: Diagnosis not present

## 2019-04-02 DIAGNOSIS — L821 Other seborrheic keratosis: Secondary | ICD-10-CM | POA: Diagnosis not present

## 2019-04-02 DIAGNOSIS — L570 Actinic keratosis: Secondary | ICD-10-CM | POA: Diagnosis not present

## 2019-04-02 DIAGNOSIS — D0359 Melanoma in situ of other part of trunk: Secondary | ICD-10-CM | POA: Diagnosis not present

## 2019-04-02 DIAGNOSIS — L989 Disorder of the skin and subcutaneous tissue, unspecified: Secondary | ICD-10-CM | POA: Diagnosis not present

## 2019-04-02 DIAGNOSIS — L905 Scar conditions and fibrosis of skin: Secondary | ICD-10-CM | POA: Diagnosis not present

## 2019-04-02 DIAGNOSIS — L814 Other melanin hyperpigmentation: Secondary | ICD-10-CM | POA: Diagnosis not present

## 2019-04-23 DIAGNOSIS — L905 Scar conditions and fibrosis of skin: Secondary | ICD-10-CM | POA: Diagnosis not present

## 2019-04-23 DIAGNOSIS — D0359 Melanoma in situ of other part of trunk: Secondary | ICD-10-CM | POA: Diagnosis not present

## 2019-07-13 DIAGNOSIS — D1801 Hemangioma of skin and subcutaneous tissue: Secondary | ICD-10-CM | POA: Diagnosis not present

## 2019-07-13 DIAGNOSIS — L821 Other seborrheic keratosis: Secondary | ICD-10-CM | POA: Diagnosis not present

## 2019-07-13 DIAGNOSIS — Z85820 Personal history of malignant melanoma of skin: Secondary | ICD-10-CM | POA: Diagnosis not present

## 2019-07-13 DIAGNOSIS — D225 Melanocytic nevi of trunk: Secondary | ICD-10-CM | POA: Diagnosis not present

## 2019-07-13 DIAGNOSIS — L814 Other melanin hyperpigmentation: Secondary | ICD-10-CM | POA: Diagnosis not present

## 2019-07-13 DIAGNOSIS — L905 Scar conditions and fibrosis of skin: Secondary | ICD-10-CM | POA: Diagnosis not present

## 2019-10-14 DIAGNOSIS — L905 Scar conditions and fibrosis of skin: Secondary | ICD-10-CM | POA: Diagnosis not present

## 2019-10-14 DIAGNOSIS — D1801 Hemangioma of skin and subcutaneous tissue: Secondary | ICD-10-CM | POA: Diagnosis not present

## 2019-10-14 DIAGNOSIS — L821 Other seborrheic keratosis: Secondary | ICD-10-CM | POA: Diagnosis not present

## 2019-10-14 DIAGNOSIS — L814 Other melanin hyperpigmentation: Secondary | ICD-10-CM | POA: Diagnosis not present

## 2019-10-14 DIAGNOSIS — D225 Melanocytic nevi of trunk: Secondary | ICD-10-CM | POA: Diagnosis not present

## 2019-10-14 DIAGNOSIS — Z85820 Personal history of malignant melanoma of skin: Secondary | ICD-10-CM | POA: Diagnosis not present

## 2019-10-27 ENCOUNTER — Other Ambulatory Visit: Payer: Self-pay

## 2019-10-27 ENCOUNTER — Ambulatory Visit (INDEPENDENT_AMBULATORY_CARE_PROVIDER_SITE_OTHER): Payer: Medicare HMO

## 2019-10-27 DIAGNOSIS — Z0000 Encounter for general adult medical examination without abnormal findings: Secondary | ICD-10-CM

## 2019-10-27 NOTE — Patient Instructions (Addendum)
Adriana Peters , Thank you for taking time to come for your Medicare Wellness Visit. I appreciate your ongoing commitment to your health goals. Please review the following plan we discussed and let me know if I can assist you in the future.   Screening recommendations/referrals: Colonoscopy: No longer required Mammogram: No longer required Bone Density: Done 11/14/17 Recommended yearly ophthalmology/optometry visit for glaucoma screening and checkup Recommended yearly dental visit for hygiene and checkup  Vaccinations: Influenza vaccine: Done 10/27/18 Due and discussed Pneumococcal vaccine: Up to date Tdap vaccine: Up to date Shingles vaccine: Shingrix discussed. Please contact your pharmacy for coverage information.    Covid-19: pt states she had both shots and will call with card information  Advanced directives: Please bring a copy of your health care power of attorney and living will to the office at your convenience.   Conditions/risks identified: Continue to eat right and exercise  Next appointment: Follow up in one year for your annual wellness visit    Preventive Care 65 Years and Older, Female Preventive care refers to lifestyle choices and visits with your health care provider that can promote health and wellness. What does preventive care include?  A yearly physical exam. This is also called an annual well check.  Dental exams once or twice a year.  Routine eye exams. Ask your health care provider how often you should have your eyes checked.  Personal lifestyle choices, including:  Daily care of your teeth and gums.  Regular physical activity.  Eating a healthy diet.  Avoiding tobacco and drug use.  Limiting alcohol use.  Practicing safe sex.  Taking low-dose aspirin every day.  Taking vitamin and mineral supplements as recommended by your health care provider. What happens during an annual well check? The services and screenings done by your health care  provider during your annual well check will depend on your age, overall health, lifestyle risk factors, and family history of disease. Counseling  Your health care provider may ask you questions about your:  Alcohol use.  Tobacco use.  Drug use.  Emotional well-being.  Home and relationship well-being.  Sexual activity.  Eating habits.  History of falls.  Memory and ability to understand (cognition).  Work and work Statistician.  Reproductive health. Screening  You may have the following tests or measurements:  Height, weight, and BMI.  Blood pressure.  Lipid and cholesterol levels. These may be checked every 5 years, or more frequently if you are over 41 years old.  Skin check.  Lung cancer screening. You may have this screening every year starting at age 84 if you have a 30-pack-year history of smoking and currently smoke or have quit within the past 15 years.  Fecal occult blood test (FOBT) of the stool. You may have this test every year starting at age 84.  Flexible sigmoidoscopy or colonoscopy. You may have a sigmoidoscopy every 5 years or a colonoscopy every 10 years starting at age 84.  Hepatitis C blood test.  Hepatitis B blood test.  Sexually transmitted disease (STD) testing.  Diabetes screening. This is done by checking your blood sugar (glucose) after you have not eaten for a while (fasting). You may have this done every 1-3 years.  Bone density scan. This is done to screen for osteoporosis. You may have this done starting at age 84.  Mammogram. This may be done every 1-2 years. Talk to your health care provider about how often you should have regular mammograms. Talk with your health care  provider about your test results, treatment options, and if necessary, the need for more tests. Vaccines  Your health care provider may recommend certain vaccines, such as:  Influenza vaccine. This is recommended every year.  Tetanus, diphtheria, and acellular  pertussis (Tdap, Td) vaccine. You may need a Td booster every 10 years.  Zoster vaccine. You may need this after age 84.  Pneumococcal 13-valent conjugate (PCV13) vaccine. One dose is recommended after age 84.  Pneumococcal polysaccharide (PPSV23) vaccine. One dose is recommended after age 82. Talk to your health care provider about which screenings and vaccines you need and how often you need them. This information is not intended to replace advice given to you by your health care provider. Make sure you discuss any questions you have with your health care provider. Document Released: 02/10/2015 Document Revised: 10/04/2015 Document Reviewed: 11/15/2014 Elsevier Interactive Patient Education  2017 [employer] Prevention in the Home Falls can cause injuries. They can happen to people of all ages. There are many things you can do to make your home safe and to help prevent falls. What can I do on the outside of my home?  Regularly fix the edges of walkways and driveways and fix any cracks.  Remove anything that might make you trip as you walk through a door, such as a raised step or threshold.  Trim any bushes or trees on the path to your home.  Use bright outdoor lighting.  Clear any walking paths of anything that might make someone trip, such as rocks or tools.  Regularly check to see if handrails are loose or broken. Make sure that both sides of any steps have handrails.  Any raised decks and porches should have guardrails on the edges.  Have any leaves, snow, or ice cleared regularly.  Use sand or salt on walking paths during winter.  Clean up any spills in your garage right away. This includes oil or grease spills. What can I do in the bathroom?  Use night lights.  Install grab bars by the toilet and in the tub and shower. Do not use towel bars as grab bars.  Use non-skid mats or decals in the tub or shower.  If you need to sit down in the shower, use a plastic,  non-slip stool.  Keep the floor dry. Clean up any water that spills on the floor as soon as it happens.  Remove soap buildup in the tub or shower regularly.  Attach bath mats securely with double-sided non-slip rug tape.  Do not have throw rugs and other things on the floor that can make you trip. What can I do in the bedroom?  Use night lights.  Make sure that you have a light by your bed that is easy to reach.  Do not use any sheets or blankets that are too big for your bed. They should not hang down onto the floor.  Have a firm chair that has side arms. You can use this for support while you get dressed.  Do not have throw rugs and other things on the floor that can make you trip. What can I do in the kitchen?  Clean up any spills right away.  Avoid walking on wet floors.  Keep items that you use a lot in easy-to-reach places.  If you need to reach something above you, use a strong step stool that has a grab bar.  Keep electrical cords out of the way.  Do not use floor polish  or wax that makes floors slippery. If you must use wax, use non-skid floor wax.  Do not have throw rugs and other things on the floor that can make you trip. What can I do with my stairs?  Do not leave any items on the stairs.  Make sure that there are handrails on both sides of the stairs and use them. Fix handrails that are broken or loose. Make sure that handrails are as long as the stairways.  Check any carpeting to make sure that it is firmly attached to the stairs. Fix any carpet that is loose or worn.  Avoid having throw rugs at the top or bottom of the stairs. If you do have throw rugs, attach them to the floor with carpet tape.  Make sure that you have a light switch at the top of the stairs and the bottom of the stairs. If you do not have them, ask someone to add them for you. What else can I do to help prevent falls?  Wear shoes that:  Do not have high heels.  Have rubber  bottoms.  Are comfortable and fit you well.  Are closed at the toe. Do not wear sandals.  If you use a stepladder:  Make sure that it is fully opened. Do not climb a closed stepladder.  Make sure that both sides of the stepladder are locked into place.  Ask someone to hold it for you, if possible.  Clearly mark and make sure that you can see:  Any grab bars or handrails.  First and last steps.  Where the edge of each step is.  Use tools that help you move around (mobility aids) if they are needed. These include:  Canes.  Walkers.  Scooters.  Crutches.  Turn on the lights when you go into a dark area. Replace any light bulbs as soon as they burn out.  Set up your furniture so you have a clear path. Avoid moving your furniture around.  If any of your floors are uneven, fix them.  If there are any pets around you, be aware of where they are.  Review your medicines with your doctor. Some medicines can make you feel dizzy. This can increase your chance of falling. Ask your doctor what other things that you can do to help prevent falls. This information is not intended to replace advice given to you by your health care provider. Make sure you discuss any questions you have with your health care provider. Document Released: 11/10/2008 Document Revised: 06/22/2015 Document Reviewed: 02/18/2014 Elsevier Interactive Patient Education  2017 [employer].

## 2019-10-27 NOTE — Progress Notes (Signed)
Virtual Visit via Telephone Note  I connected with  NASHIA REMUS on 10/27/19 at  8:45 AM EDT by telephone and verified that I am speaking with the correct person using two identifiers.  Medicare Annual Wellness visit completed telephonically due to Covid-19 pandemic.   Persons participating in this call: This Health Coach and this patient.   Location: Patient: Home Provider: Office   I discussed the limitations, risks, security and privacy concerns of performing an evaluation and management service by telephone and the availability of in person appointments. The patient expressed understanding and agreed to proceed.  Unable to perform video visit due to video visit attempted and failed and/or patient does not have video capability.   Some vital signs may be absent or patient reported.   Willette Brace, LPN    Subjective:   NEEMA BARREIRA is a 86 y.o. female who presents for Medicare Annual (Subsequent) preventive examination.  Review of Systems     Cardiac Risk Factors include: advanced age (>39men, >41 women)     Objective:    There were no vitals filed for this visit. There is no height or weight on file to calculate BMI.  Advanced Directives 10/27/2019  Does Patient Have a Medical Advance Directive? Yes  Type of Paramedic of [place];Living will  Copy of [place] in Chart? No - copy requested    Current Medications (verified) Outpatient Encounter Medications as of 10/27/2019  Medication Sig  . ARMOUR THYROID 60 MG tablet TAKE 1 TABLET BY MOUTH EVERY MORNING WITH BREAKFAST   No facility-administered encounter medications on file as of 10/27/2019.    Allergies (verified) Patient has no known allergies.   History: Past Medical History:  Diagnosis Date  . Arthritis   . GERD (gastroesophageal reflux disease)   . Osteoporosis   . Thyroid disease    Hypothyroidism.   History reviewed. No pertinent surgical  history. Family History  Problem Relation Age of Onset  . Diabetes Neg Hx   . Cancer Neg Hx    Social History   Socioeconomic History  . Marital status: Married    Spouse name: Not on file  . Number of children: Not on file  . Years of education: Not on file  . Highest education level: Not on file  Occupational History  . Not on file  Tobacco Use  . Smoking status: Never Smoker  . Smokeless tobacco: Never Used  Substance and Sexual Activity  . Alcohol use: Yes    Comment: Wine  . Drug use: No  . Sexual activity: Not Currently  Other Topics Concern  . Not on file  Social History Narrative  . Not on file   Social Determinants of Health   Financial Resource Strain: Low Risk   . Difficulty of Paying Living Expenses: Not hard at all  Food Insecurity: No Food Insecurity  . Worried About Charity fundraiser in the Last Year: Never true  . Ran Out of Food in the Last Year: Never true  Transportation Needs: No Transportation Needs  . Lack of Transportation (Medical): No  . Lack of Transportation (Non-Medical): No  Physical Activity: Sufficiently Active  . Days of Exercise per Week: 5 days  . Minutes of Exercise per Session: 30 min  Stress: No Stress Concern Present  . Feeling of Stress : Only a little  Social Connections: Moderately Isolated  . Frequency of Communication with Friends and Family: Once a week  . Frequency  of Social Gatherings with Friends and Family: More than three times a week  . Attends Religious Services: Never  . Active Member of Clubs or Organizations: No  . Attends Archivist Meetings: Never  . Marital Status: Married    Tobacco Counseling Counseling given: Not Answered   Clinical Intake:  Pre-visit preparation completed: Yes  Pain : No/denies pain     BMI - recorded: 21.05 Nutritional Status: BMI of 19-24  Normal Nutritional Risks: None Diabetes: No  How often do you need to have someone help you when you read instructions,  pamphlets, or other written materials from your doctor or pharmacy?: 1 - Never  Diabetic?No  Interpreter Needed?: No  Information entered by :: Charlott Rakes, LPN   Activities of Daily Living In your present state of health, do you have any difficulty performing the following activities: 10/27/2019  Hearing? N  Vision? N  Difficulty concentrating or making decisions? N  Walking or climbing stairs? N  Dressing or bathing? N  Doing errands, shopping? N  Preparing Food and eating ? N  Using the Toilet? N  In the past six months, have you accidently leaked urine? N  Do you have problems with loss of bowel control? N  Managing your Medications? N  Managing your Finances? N  Housekeeping or managing your Housekeeping? N  Some recent data might be hidden    Patient Care Team: [name], [name], MD as PCP - General (Family Medicine)  Indicate any recent Medical Services you may have received from other than [facility] providers in the past year (date may be approximate).     Assessment:   This is a routine wellness examination for Ketrina.  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 Patient Stated     Eat right and exercise      Depression Screen PHQ 2/9 Scores 10/27/2019 09/15/2018 05/06/2017 02/08/2016  PHQ - 2 Score 0 0 0 0    Fall Risk Fall Risk  10/27/2019 09/15/2018 05/06/2017 02/08/2016  Falls in the past year? 0 1 No Yes  Number falls in past yr: 0 0 - 1  Injury with Fall? 0 0 - No  Risk for fall due to : - History of fall(s) - -  Follow up Falls  prevention discussed Falls evaluation completed - -    Any stairs in or around the home? Yes  If so, are there any without handrails? Yes  Home free of loose throw rugs in walkways, pet beds, electrical cords, etc? Yes  Adequate lighting in your home to reduce risk of falls? Yes   ASSISTIVE DEVICES UTILIZED TO PREVENT FALLS:  Life alert? No  Use of a cane, walker or w/c? No  Grab bars in the bathroom? No  Shower chair or bench in shower? No  Elevated toilet seat or a handicapped toilet? No   TIMED UP AND GO:  Was the test performed? No .      Cognitive Function:  6CIT Screen 10/27/2019  What Year? 4 points  What month? 0 points  Count back from 20 0 points  Months in reverse 0 points  Repeat phrase 8 points    Immunizations Immunization History  Administered Date(s) Administered  . Fluad Quad(high Dose 65+) 10/27/2018  . Influenza, High Dose Seasonal PF 11/19/2013, 11/01/2016, 10/28/2017  . Influenza-Unspecified 10/10/2016, 10/27/2018  . Pneumococcal Polysaccharide-23 02/18/2013  . Tdap 01/29/2011    TDAP status: Up to date Flu Vaccine status: Declined, Education has been provided regarding the importance of this vaccine but patient still declined. Advised may receive this vaccine at local pharmacy or Health Dept. Aware to provide a copy of the vaccination record if obtained from local pharmacy or Health Dept. Verbalized acceptance and understanding. Pneumococcal vaccine status: Up to date Covid-19 vaccine status: Completed vaccines  Qualifies for Shingles Vaccine? Yes   Zostavax completed No   Shingrix Completed?: No.    Education has been provided regarding the importance of this vaccine. Patient has been advised to call insurance company to determine out of pocket expense if they have not yet received this vaccine. Advised may also receive vaccine at local pharmacy or Health Dept. Verbalized acceptance and understanding.  Screening Tests Health Maintenance    Topic Date Due  . COVID-19 Vaccine (1) Never done  . INFLUENZA VACCINE  08/29/2019  . TETANUS/TDAP  01/28/2021  . DEXA SCAN  Completed  . PNA vac Low Risk Adult  Completed    Health Maintenance  Health Maintenance Due  Topic Date Due  . COVID-19 Vaccine (1) Never done  . INFLUENZA VACCINE  08/29/2019    Colorectal cancer screening: No longer required.  Mammogram status: No longer required.  Bone Density status: Completed 11/14/17. Results reflect: Bone density results: OSTEOPOROSIS. Repeat every 2 years.   Additional Screening:    Vision Screening: Recommended annual ophthalmology exams for early detection of glaucoma and other disorders of the eye. Is the patient up to date with their annual eye exam?  Yes  Who is the provider or what is the name of the office in which the patient attends annual eye exams? [place] [facility]  Dental Screening: Recommended annual dental exams for proper oral hygiene  Community Resource Referral / Chronic Care Management: CRR required this visit?  No   CCM required this visit?  No      Plan:     I have personally reviewed and noted the following in the patient's chart:   . Medical and social history . Use of alcohol, tobacco or illicit drugs  . Current medications and supplements . Functional ability and status . Nutritional status . Physical activity . Advanced directives . List of other physicians . Hospitalizations, surgeries, and ER visits in previous 12 months . Vitals . Screenings to include cognitive, depression, and falls . Referrals and appointments  In addition, I have reviewed and discussed with patient certain preventive protocols, quality metrics, and best practice recommendations. A written personalized care plan for preventive services as well as general preventive health recommendations were provided to patient.     Willette Brace, LPN   04/26/760   Nurse Notes: None

## 2019-11-25 DIAGNOSIS — H35373 Puckering of macula, bilateral: Secondary | ICD-10-CM | POA: Diagnosis not present

## 2020-03-02 ENCOUNTER — Other Ambulatory Visit: Payer: Self-pay | Admitting: Family Medicine

## 2020-03-02 DIAGNOSIS — E038 Other specified hypothyroidism: Secondary | ICD-10-CM

## 2020-10-31 ENCOUNTER — Ambulatory Visit: Payer: Medicare HMO

## 2020-10-31 ENCOUNTER — Other Ambulatory Visit: Payer: Self-pay

## 2020-11-21 ENCOUNTER — Ambulatory Visit: Payer: Medicare HMO

## 2020-11-21 ENCOUNTER — Other Ambulatory Visit: Payer: Self-pay

## 2020-12-04 ENCOUNTER — Telehealth: Payer: Self-pay | Admitting: Family Medicine

## 2020-12-04 NOTE — Telephone Encounter (Signed)
Left message for patient to call back and schedule Medicare Annual Wellness Visit (AWV) either virtually or in office. Left  my Herbie Drape number [phone]   Last AWV 10/07/19 please schedule at anytime with [facility] [place] [facility] or 2   This should be a 45 minute visit.   Patient also needs appt with pcp last appt 09/15/2018

## 2021-03-18 ENCOUNTER — Other Ambulatory Visit: Payer: Self-pay | Admitting: Family Medicine

## 2021-03-18 DIAGNOSIS — E038 Other specified hypothyroidism: Secondary | ICD-10-CM

## 2021-03-19 ENCOUNTER — Other Ambulatory Visit: Payer: Self-pay

## 2021-03-19 DIAGNOSIS — E038 Other specified hypothyroidism: Secondary | ICD-10-CM

## 2021-03-19 MED ORDER — THYROID 60 MG PO TABS: 60.0000 mg | ORAL_TABLET | Freq: Every day | ORAL | 0 refills | Status: DC

## 2021-03-20 NOTE — Progress Notes (Unsigned)
° ° °  HPI: Ms.Ahsha TYRIA SPRINGER is a 88 y.o. female, who is here today for her routine physical.  Last CPE: 11/21/20 - AWV  Regular exercise 3 or more time per week: *** Following a healthy diet: *** She lives with ***  Chronic medical problems: ***  Immunization History  Administered Date(s) Administered   Fluad Quad(high Dose 65+) 10/27/2018   Influenza, High Dose Seasonal PF 11/19/2013, 11/01/2016, 10/28/2017   Influenza-Unspecified 10/10/2016, 10/27/2018   Pneumococcal Polysaccharide-23 02/18/2013   Tdap 01/29/2011   Health Maintenance  Topic Date Due   COVID-19 Vaccine (1) Never done   Zoster Vaccines- Shingrix (1 of 2) Never done   Pneumonia Vaccine 48+ Years old (2 - PCV) 02/18/2014   INFLUENZA VACCINE  08/28/2020   TETANUS/TDAP  01/28/2021   DEXA SCAN  Completed   HPV VACCINES  Aged Out    She has *** concerns today.  Review of Systems  Current Outpatient Medications on File Prior to Visit  Medication Sig Dispense Refill   thyroid (ARMOUR THYROID) 60 MG tablet Take 1 tablet (60 mg total) by mouth daily with breakfast. 30 tablet 0   No current facility-administered medications on file prior to visit.     Past Medical History:  Diagnosis Date   Arthritis    GERD (gastroesophageal reflux disease)    Osteoporosis    Thyroid disease    Hypothyroidism.    No past surgical history on file.  No Known Allergies  Family History  Problem Relation Age of Onset   Diabetes Neg Hx    Cancer Neg Hx     Social History   Socioeconomic History   Marital status: Married    Spouse name: Not on file   Number of children: Not on file   Years of education: Not on file   Highest education level: Not on file  Occupational History   Not on file  Tobacco Use   Smoking status: Never   Smokeless tobacco: Never  Substance and Sexual Activity   Alcohol use: Yes    Comment: Wine   Drug use: No   Sexual activity: Not Currently  Other Topics Concern   Not on file   Social History Narrative   Not on file   Social Determinants of Health   Financial Resource Strain: Not on file  Food Insecurity: Not on file  Transportation Needs: Not on file  Physical Activity: Not on file  Stress: Not on file  Social Connections: Not on file    There were no vitals filed for this visit. There is no height or weight on file to calculate BMI.  Wt Readings from Last 3 Encounters:  09/15/18 104 lb 3.2 oz (47.3 kg)  05/06/17 106 lb 6 oz (48.3 kg)  08/12/16 105 lb 12.8 oz (48 kg)     Physical Exam  ASSESSMENT AND PLAN:  Ms. ADALIND WEITZ was here today annual physical examination.  No orders of the defined types were placed in this encounter.   There are no diagnoses linked to this encounter.  No problem-specific Assessment & Plan notes found for this encounter.   No follow-ups on file.  [name]. [name], MD  [facility]. [place] office.

## 2021-03-21 ENCOUNTER — Encounter: Payer: Medicare HMO | Admitting: Family Medicine

## 2021-03-21 DIAGNOSIS — Z0000 Encounter for general adult medical examination without abnormal findings: Secondary | ICD-10-CM

## 2021-03-21 DIAGNOSIS — E039 Hypothyroidism, unspecified: Secondary | ICD-10-CM

## 2021-04-13 NOTE — Progress Notes (Deleted)
? ? ?HPI: ?[name] is a 88 y.o. female, who is here today for her routine physical. ? ?Last CPE: 2020 ? ?Regular exercise 3 or more time per week: *** ?Following a healthy diet: *** ?She lives with *** ? ?Chronic medical problems: *** ? ?Immunization History  ?Administered Date(s) Administered  ?? Fluad Quad(high Dose 65+) 10/27/2018  ?? Influenza, High Dose Seasonal PF 11/19/2013, 11/01/2016, 10/28/2017  ?? Influenza-Unspecified 10/10/2016, 10/27/2018  ?? Pneumococcal Polysaccharide-23 02/18/2013  ?? Tdap 01/29/2011  ? ?Health Maintenance  ?Topic Date Due  ?? COVID-19 Vaccine (1) Never done  ?? Pneumonia Vaccine 64+ Years old (2 - PCV) 02/18/2014  ?? INFLUENZA VACCINE  04/27/2021 (Originally 08/28/2020)  ?? TETANUS/TDAP  02/01/2022 (Originally 01/28/2021)  ?? Zoster Vaccines- Shingrix (1 of 2) 02/01/2022 (Originally 09/17/1980)  ?? DEXA SCAN  Completed  ?? HPV VACCINES  Aged Out  ? ? ?She has *** concerns today. ? ?Review of Systems  ?Constitutional: Negative.   ?HENT: Negative.    ?Eyes: Negative.   ?Respiratory: Negative.    ?Cardiovascular: Negative.   ?Gastrointestinal: Negative.   ?Endocrine: Negative.   ?Genitourinary: Negative.   ?Musculoskeletal: Negative.   ?Skin: Negative.   ?Allergic/Immunologic: Negative.   ?Neurological: Negative.   ?Hematological: Negative.   ?Psychiatric/Behavioral: Negative.    ? ?Current Outpatient Medications on File Prior to Visit  ?Medication Sig Dispense Refill  ?? thyroid (ARMOUR THYROID) 60 MG tablet Take 1 tablet (60 mg total) by mouth daily with breakfast. 30 tablet 0  ? ?No current facility-administered medications on file prior to visit.  ? ? ? ?Past Medical History:  ?Diagnosis Date  ?? Arthritis   ?? GERD (gastroesophageal reflux disease)   ?? Osteoporosis   ?? Thyroid disease   ? Hypothyroidism.  ? ? ?No past surgical history on file. ? ?No Known Allergies ? ?Family History  ?Problem Relation Age of Onset  ?? Diabetes Neg Hx   ?? Cancer Neg Hx   ? ? ?Social  History  ? ?Socioeconomic History  ?? Marital status: Married  ?  Spouse name: Not on file  ?? Number of children: Not on file  ?? Years of education: Not on file  ?? Highest education level: Not on file  ?Occupational History  ?? Not on file  ?Tobacco Use  ?? Smoking status: Never  ?? Smokeless tobacco: Never  ?Substance and Sexual Activity  ?? Alcohol use: Yes  ?  Comment: Wine  ?? Drug use: No  ?? Sexual activity: Not Currently  ?Other Topics Concern  ?? Not on file  ?Social History Narrative  ?? Not on file  ? ?Social Determinants of Health  ? ?Financial Resource Strain: Not on file  ?Food Insecurity: Not on file  ?Transportation Needs: Not on file  ?Physical Activity: Not on file  ?Stress: Not on file  ?Social Connections: Not on file  ? ? ?There were no vitals filed for this visit. ?There is no height or weight on file to calculate BMI. ? ?Wt Readings from Last 3 Encounters:  ?09/15/18 104 lb 3.2 oz (47.3 kg)  ?05/06/17 106 lb 6 oz (48.3 kg)  ?08/12/16 105 lb 12.8 oz (48 kg)  ? ? ? ?Physical Exam ?Vitals and nursing note reviewed.  ?Constitutional:   ?   Appearance: Normal appearance. She is normal weight.  ?HENT:  ?   Head: Normocephalic.  ?   Right Ear: Tympanic membrane, ear canal and external ear normal.  ?   Left Ear: Tympanic membrane, ear  canal and external ear normal.  ?   Nose: Nose normal.  ?   Mouth/Throat:  ?   Mouth: Mucous membranes are moist.  ?   Pharynx: Oropharynx is clear.  ?Eyes:  ?   Extraocular Movements: Extraocular movements intact.  ?   Conjunctiva/sclera: Conjunctivae normal.  ?   Pupils: Pupils are equal, round, and reactive to light.  ?Cardiovascular:  ?   Rate and Rhythm: Normal rate and regular rhythm.  ?   Pulses: Normal pulses.  ?   Heart sounds: Normal heart sounds.  ?Pulmonary:  ?   Effort: Pulmonary effort is normal.  ?   Breath sounds: Normal breath sounds.  ?Abdominal:  ?   General: Abdomen is flat. Bowel sounds are normal.  ?   Palpations: Abdomen is soft.   ?Musculoskeletal:     ?   General: Normal range of motion.  ?   Cervical back: Normal range of motion and neck supple.  ?Skin: ?   General: Skin is warm.  ?Neurological:  ?   Mental Status: She is alert and oriented to person, place, and time.  ? ?ASSESSMENT AND PLAN: ? ?[name] was here today annual physical examination. ? ?No orders of the defined types were placed in this encounter. ? ? ?There are no diagnoses linked to this encounter. ? ?No problem-specific Assessment & Plan notes found for this encounter. ? ? ?No follow-ups on file. ? ?[name]. [name], MD ? ?[place]. ?[place] office. ? ? ? ? ? ? ? ? ? ? ? ? ? ?

## 2021-04-16 ENCOUNTER — Other Ambulatory Visit: Payer: Self-pay | Admitting: Family Medicine

## 2021-04-16 ENCOUNTER — Encounter: Payer: Medicare HMO | Admitting: Family Medicine

## 2021-04-16 DIAGNOSIS — E038 Other specified hypothyroidism: Secondary | ICD-10-CM

## 2021-04-16 DIAGNOSIS — E039 Hypothyroidism, unspecified: Secondary | ICD-10-CM

## 2021-04-24 ENCOUNTER — Other Ambulatory Visit: Payer: Self-pay | Admitting: Family Medicine

## 2021-04-24 DIAGNOSIS — E038 Other specified hypothyroidism: Secondary | ICD-10-CM

## 2021-04-25 ENCOUNTER — Other Ambulatory Visit: Payer: Self-pay | Admitting: Family Medicine

## 2021-04-25 DIAGNOSIS — E038 Other specified hypothyroidism: Secondary | ICD-10-CM

## 2021-04-25 NOTE — Progress Notes (Deleted)
? ? ?  HPI: ? ?Adriana Peters is a 88 y.o. female, who is here today to follow on medication. ? ?Review of Systems ?Rest see pertinent positives and negatives per HPI. ? ?Current Outpatient Medications on File Prior to Visit  ?Medication Sig Dispense Refill  ? thyroid (ARMOUR THYROID) 60 MG tablet Take 1 tablet (60 mg total) by mouth daily with breakfast. 30 tablet 0  ? ?No current facility-administered medications on file prior to visit.  ? ? ?Past Medical History:  ?Diagnosis Date  ? Arthritis   ? GERD (gastroesophageal reflux disease)   ? Osteoporosis   ? Thyroid disease   ? Hypothyroidism.  ? ?No Known Allergies ? ?Social History  ? ?Socioeconomic History  ? Marital status: Married  ?  Spouse name: Not on file  ? Number of children: Not on file  ? Years of education: Not on file  ? Highest education level: Not on file  ?Occupational History  ? Not on file  ?Tobacco Use  ? Smoking status: Never  ? Smokeless tobacco: Never  ?Substance and Sexual Activity  ? Alcohol use: Yes  ?  Comment: Wine  ? Drug use: No  ? Sexual activity: Not Currently  ?Other Topics Concern  ? Not on file  ?Social History Narrative  ? Not on file  ? ?Social Determinants of Health  ? ?Financial Resource Strain: Not on file  ?Food Insecurity: Not on file  ?Transportation Needs: Not on file  ?Physical Activity: Not on file  ?Stress: Not on file  ?Social Connections: Not on file  ? ? ?There were no vitals filed for this visit. ?There is no height or weight on file to calculate BMI. ? ?Physical Exam ? ?ASSESSMENT AND PLAN: ? ? ?There are no diagnoses linked to this encounter. ? ?No orders of the defined types were placed in this encounter. ? ? ?No problem-specific Assessment & Plan notes found for this encounter. ? ? ?No follow-ups on file. ? ? ?[name]. [name], MD ? ?[place]. ?[place] office. ? ? ? ? ? ? ? ? ? ? ? ? ? ? ? ?

## 2021-04-27 ENCOUNTER — Ambulatory Visit: Payer: Medicare HMO | Admitting: Family Medicine

## 2021-05-01 ENCOUNTER — Other Ambulatory Visit: Payer: Self-pay | Admitting: Family Medicine

## 2021-05-01 DIAGNOSIS — E038 Other specified hypothyroidism: Secondary | ICD-10-CM

## 2021-05-14 NOTE — Progress Notes (Deleted)
    HPI: [name] is a 88 y.o. female, who is here today for her routine physical.  Last CPE: ***  Regular exercise 3 or more time per week: *** Following a healthy diet: *** She lives with ***  Chronic medical problems: ***  Immunization History  Administered Date(s) Administered   Fluad Quad(high Dose 65+) 10/27/2018   Influenza, High Dose Seasonal PF 11/19/2013, 11/01/2016, 10/28/2017   Influenza-Unspecified 10/10/2016, 10/27/2018   Pneumococcal Polysaccharide-23 02/18/2013   Tdap 01/29/2011   Health Maintenance  Topic Date Due   COVID-19 Vaccine (1) Never done   Pneumonia Vaccine 38+ Years old (2 - PCV) 02/18/2014   TETANUS/TDAP  02/01/2022 (Originally 01/28/2021)   Zoster Vaccines- Shingrix (1 of 2) 02/01/2022 (Originally 09[birth year]17/1980)   INFLUENZA VACCINE  08/28/2021   DEXA SCAN  Completed   HPV VACCINES  Aged Out    She has *** concerns today.  Review of Systems  Current Outpatient Medications on File Prior to Visit  Medication Sig Dispense Refill   ARMOUR THYROID 60 MG tablet TAKE 1 TABLET (60 MG TOTAL) BY MOUTH DAILY WITH BREAKFAST. 30 tablet 0   No current facility-administered medications on file prior to visit.     Past Medical History:  Diagnosis Date   Arthritis    GERD (gastroesophageal reflux disease)    Osteoporosis    Thyroid disease    Hypothyroidism.    No past surgical history on file.  No Known Allergies  Family History  Problem Relation Age of Onset   Diabetes Neg Hx    Cancer Neg Hx     Social History   Socioeconomic History   Marital status: Married    Spouse name: Not on file   Number of children: Not on file   Years of education: Not on file   Highest education level: Not on file  Occupational History   Not on file  Tobacco Use   Smoking status: Never   Smokeless tobacco: Never  Substance and Sexual Activity   Alcohol use: Yes    Comment: Wine   Drug use: No   Sexual activity: Not Currently  Other Topics  Concern   Not on file  Social History Narrative   Not on file   Social Determinants of Health   Financial Resource Strain: Not on file  Food Insecurity: Not on file  Transportation Needs: Not on file  Physical Activity: Not on file  Stress: Not on file  Social Connections: Not on file    There were no vitals filed for this visit. There is no height or weight on file to calculate BMI.  Wt Readings from Last 3 Encounters:  09/15/18 104 lb 3.2 oz (47.3 kg)  05/06/17 106 lb 6 oz (48.3 kg)  08/12/16 105 lb 12.8 oz (48 kg)     Physical Exam  ASSESSMENT AND PLAN:  [name] was here today annual physical examination.  No orders of the defined types were placed in this encounter.   There are no diagnoses linked to this encounter.  No problem-specific Assessment & Plan notes found for this encounter.   No follow-ups on file.  [name]. [name], MD  [facility] [place] office.

## 2021-05-15 ENCOUNTER — Encounter: Payer: Medicare HMO | Admitting: Family Medicine

## 2021-06-12 ENCOUNTER — Other Ambulatory Visit: Payer: Self-pay | Admitting: Family Medicine

## 2021-06-12 DIAGNOSIS — E038 Other specified hypothyroidism: Secondary | ICD-10-CM

## 2021-06-15 ENCOUNTER — Other Ambulatory Visit: Payer: Self-pay

## 2021-06-15 ENCOUNTER — Telehealth: Payer: Self-pay | Admitting: Family Medicine

## 2021-06-15 DIAGNOSIS — E038 Other specified hypothyroidism: Secondary | ICD-10-CM

## 2021-06-15 MED ORDER — THYROID 60 MG PO TABS: ORAL_TABLET | ORAL | 0 refills | Status: DC

## 2021-06-15 NOTE — Telephone Encounter (Signed)
Patient needs a refill on Armour Thyroid 60 mg tablet.  Patient is out of medication.  Pharmacy- [place].

## 2021-06-15 NOTE — Telephone Encounter (Signed)
Patient needs appointment. Has no-showed last 3-4 appointments.

## 2021-06-15 NOTE — Telephone Encounter (Signed)
LVM for pt to call and make a follow up appt for a refill.

## 2021-06-21 NOTE — Telephone Encounter (Signed)
LVM for pt to call and make an appt to get her refill.

## 2021-07-04 ENCOUNTER — Other Ambulatory Visit: Payer: Self-pay

## 2021-07-04 ENCOUNTER — Telehealth: Payer: Self-pay | Admitting: Family Medicine

## 2021-07-04 DIAGNOSIS — E038 Other specified hypothyroidism: Secondary | ICD-10-CM

## 2021-07-04 MED ORDER — THYROID 60 MG PO TABS: ORAL_TABLET | ORAL | 0 refills | Status: DC

## 2021-07-04 NOTE — Telephone Encounter (Signed)
She needs an appointment.

## 2021-07-04 NOTE — Telephone Encounter (Signed)
Needs appointment

## 2021-07-04 NOTE — Telephone Encounter (Signed)
Patient came in to request a refill of the Armour Thyroid 60 mg tablet. Please advise.  (IC)

## 2021-07-04 NOTE — Telephone Encounter (Signed)
Patient came in to request a refill of Armour Thyroid 60 mg tablet. Please advise. (IC)

## 2021-07-06 NOTE — Progress Notes (Deleted)
    HPI:  [name] is a 88 y.o. female, who is here today to follow on medication.  Review of Systems Rest see pertinent positives and negatives per HPI.  Current Outpatient Medications on File Prior to Visit  Medication Sig Dispense Refill   thyroid (ARMOUR THYROID) 60 MG tablet TAKE 1 TABLET (60 MG TOTAL) BY MOUTH DAILY WITH BREAKFAST. NO FURTHER RX WITHOUT APPOINTMENT. 10 tablet 0   No current facility-administered medications on file prior to visit.    Past Medical History:  Diagnosis Date   Arthritis    GERD (gastroesophageal reflux disease)    Osteoporosis    Thyroid disease    Hypothyroidism.   No Known Allergies  Social History   Socioeconomic History   Marital status: Married    Spouse name: Not on file   Number of children: Not on file   Years of education: Not on file   Highest education level: Not on file  Occupational History   Not on file  Tobacco Use   Smoking status: Never   Smokeless tobacco: Never  Substance and Sexual Activity   Alcohol use: Yes    Comment: Wine   Drug use: No   Sexual activity: Not Currently  Other Topics Concern   Not on file  Social History Narrative   Not on file   Social Determinants of Health   Financial Resource Strain: Low Risk  (10/27/2019)   Overall Financial Resource Strain (CARDIA)    Difficulty of Paying Living Expenses: Not hard at all  Food Insecurity: No Food Insecurity (10/27/2019)   Hunger Vital Sign    Worried About Running Out of Food in the Last Year: Never true    [place] in the Last Year: Never true  Transportation Needs: No Transportation Needs (10/27/2019)   PRAPARE - Hydrologist (Medical): No    Lack of Transportation (Non-Medical): No  Physical Activity: Sufficiently Active (10/27/2019)   Exercise Vital Sign    Days of Exercise per Week: 5 days    Minutes of Exercise per Session: 30 min  Stress: No Stress Concern Present (10/27/2019)   [place]    Feeling of Stress : Only a little  Social Connections: Moderately Isolated (10/27/2019)   Social Connection and Isolation Panel [NHANES]    Frequency of Communication with Friends and Family: Once a week    Frequency of Social Gatherings with Friends and Family: More than three times a week    Attends Religious Services: Never    Marine scientist or Organizations: No    Attends Archivist Meetings: Never    Marital Status: Married    There were no vitals filed for this visit. There is no height or weight on file to calculate BMI.  Physical Exam  ASSESSMENT AND PLAN:   There are no diagnoses linked to this encounter.  No orders of the defined types were placed in this encounter.   No problem-specific Assessment & Plan notes found for this encounter.   No follow-ups on file.   [name]. [name], MD  [facility]. [place] office.

## 2021-07-09 ENCOUNTER — Ambulatory Visit: Payer: Medicare HMO | Admitting: Family Medicine

## 2021-07-09 ENCOUNTER — Telehealth: Payer: Self-pay | Admitting: Family Medicine

## 2021-07-09 NOTE — Telephone Encounter (Signed)
Left message for patient to call back and schedule Medicare Annual Wellness Visit (AWV) either virtually or in office. Left  my Herbie Drape number [phone]   Last AWV ;10/27/19  please schedule at anytime with [facility] Nurse Health Advisor 1 or 2

## 2021-07-13 ENCOUNTER — Other Ambulatory Visit: Payer: Self-pay | Admitting: Family Medicine

## 2021-07-13 ENCOUNTER — Telehealth: Payer: Self-pay | Admitting: Family Medicine

## 2021-07-13 DIAGNOSIS — E038 Other specified hypothyroidism: Secondary | ICD-10-CM

## 2021-07-13 NOTE — Telephone Encounter (Signed)
Patient thought her appt with Dr. [name] was today.  Her appt isn't until Monday.  She ran out of her Armour Thyroid 60 mg today.  She needs a refill to make it through to her appt on Monday.  Please advise patient.    Pharmacy- [place].

## 2021-07-16 ENCOUNTER — Ambulatory Visit: Payer: Medicare HMO | Admitting: Family Medicine

## 2021-07-16 NOTE — Telephone Encounter (Signed)
Has appt today

## 2021-07-16 NOTE — Progress Notes (Deleted)
    HPI:  [name] is a 88 y.o. female, who is here today to follow on her thyroid.  Review of Systems Rest see pertinent positives and negatives per HPI.  Current Outpatient Medications on File Prior to Visit  Medication Sig Dispense Refill   thyroid (ARMOUR THYROID) 60 MG tablet TAKE 1 TABLET (60 MG TOTAL) BY MOUTH DAILY WITH BREAKFAST. NO FURTHER RX WITHOUT APPOINTMENT. 10 tablet 0   No current facility-administered medications on file prior to visit.    Past Medical History:  Diagnosis Date   Arthritis    GERD (gastroesophageal reflux disease)    Osteoporosis    Thyroid disease    Hypothyroidism.   No Known Allergies  Social History   Socioeconomic History   Marital status: Married    Spouse name: Not on file   Number of children: Not on file   Years of education: Not on file   Highest education level: Not on file  Occupational History   Not on file  Tobacco Use   Smoking status: Never   Smokeless tobacco: Never  Substance and Sexual Activity   Alcohol use: Yes    Comment: Wine   Drug use: No   Sexual activity: Not Currently  Other Topics Concern   Not on file  Social History Narrative   Not on file   Social Determinants of Health   Financial Resource Strain: Low Risk  (10/27/2019)   Overall Financial Resource Strain (CARDIA)    Difficulty of Paying Living Expenses: Not hard at all  Food Insecurity: No Food Insecurity (10/27/2019)   Hunger Vital Sign    Worried About Running Out of Food in the Last Year: Never true    [place] in the Last Year: Never true  Transportation Needs: No Transportation Needs (10/27/2019)   PRAPARE - Hydrologist (Medical): No    Lack of Transportation (Non-Medical): No  Physical Activity: Sufficiently Active (10/27/2019)   Exercise Vital Sign    Days of Exercise per Week: 5 days    Minutes of Exercise per Session: 30 min  Stress: No Stress Concern Present (10/27/2019)   [place]    Feeling of Stress : Only a little  Social Connections: Moderately Isolated (10/27/2019)   Social Connection and Isolation Panel [NHANES]    Frequency of Communication with Friends and Family: Once a week    Frequency of Social Gatherings with Friends and Family: More than three times a week    Attends Religious Services: Never    Marine scientist or Organizations: No    Attends Archivist Meetings: Never    Marital Status: Married    There were no vitals filed for this visit. There is no height or weight on file to calculate BMI.  Physical Exam  ASSESSMENT AND PLAN:   There are no diagnoses linked to this encounter.  No orders of the defined types were placed in this encounter.   No problem-specific Assessment & Plan notes found for this encounter.   No follow-ups on file.   [name]. [name], MD  [facility]. [place] office.

## 2021-07-17 NOTE — Progress Notes (Deleted)
    HPI:  Adriana Peters is a 88 y.o. female, who is here today to follow on her thyroid.  Review of Systems Rest see pertinent positives and negatives per HPI.  Current Outpatient Medications on File Prior to Visit  Medication Sig Dispense Refill   thyroid (ARMOUR THYROID) 60 MG tablet TAKE 1 TABLET BY MOUTH EVERY DAY WITH BREAKFAST 10 tablet 0   No current facility-administered medications on file prior to visit.    Past Medical History:  Diagnosis Date   Arthritis    GERD (gastroesophageal reflux disease)    Osteoporosis    Thyroid disease    Hypothyroidism.   No Known Allergies  Social History   Socioeconomic History   Marital status: Married    Spouse name: Not on file   Number of children: Not on file   Years of education: Not on file   Highest education level: Not on file  Occupational History   Not on file  Tobacco Use   Smoking status: Never   Smokeless tobacco: Never  Substance and Sexual Activity   Alcohol use: Yes    Comment: Wine   Drug use: No   Sexual activity: Not Currently  Other Topics Concern   Not on file  Social History Narrative   Not on file   Social Determinants of Health   Financial Resource Strain: Low Risk  (10/27/2019)   Overall Financial Resource Strain (CARDIA)    Difficulty of Paying Living Expenses: Not hard at all  Food Insecurity: No Food Insecurity (10/27/2019)   Hunger Vital Sign    Worried About Running Out of Food in the Last Year: Never true    [place] in the Last Year: Never true  Transportation Needs: No Transportation Needs (10/27/2019)   PRAPARE - Hydrologist (Medical): No    Lack of Transportation (Non-Medical): No  Physical Activity: Sufficiently Active (10/27/2019)   Exercise Vital Sign    Days of Exercise per Week: 5 days    Minutes of Exercise per Session: 30 min  Stress: No Stress Concern Present (10/27/2019)   [place]    Feeling of Stress : Only a little  Social Connections: Moderately Isolated (10/27/2019)   Social Connection and Isolation Panel [NHANES]    Frequency of Communication with Friends and Family: Once a week    Frequency of Social Gatherings with Friends and Family: More than three times a week    Attends Religious Services: Never    Marine scientist or Organizations: No    Attends Archivist Meetings: Never    Marital Status: Married    There were no vitals filed for this visit. There is no height or weight on file to calculate BMI.  Physical Exam  ASSESSMENT AND PLAN:   There are no diagnoses linked to this encounter.  No orders of the defined types were placed in this encounter.   No problem-specific Assessment & Plan notes found for this encounter.   No follow-ups on file.   [name]. [name], MD  [facility]. [place] office.

## 2021-07-18 ENCOUNTER — Ambulatory Visit: Payer: Medicare HMO | Admitting: Family Medicine

## 2021-07-18 NOTE — Telephone Encounter (Signed)
Pt was scheduled for 07/18/21 at 11:30 am.

## 2021-07-19 DIAGNOSIS — R69 Illness, unspecified: Secondary | ICD-10-CM | POA: Diagnosis not present

## 2021-07-30 ENCOUNTER — Encounter (HOSPITAL_BASED_OUTPATIENT_CLINIC_OR_DEPARTMENT_OTHER): Payer: Self-pay | Admitting: Obstetrics and Gynecology

## 2021-07-30 ENCOUNTER — Emergency Department (HOSPITAL_BASED_OUTPATIENT_CLINIC_OR_DEPARTMENT_OTHER): Payer: Medicare HMO

## 2021-07-30 ENCOUNTER — Other Ambulatory Visit (HOSPITAL_BASED_OUTPATIENT_CLINIC_OR_DEPARTMENT_OTHER): Payer: Self-pay

## 2021-07-30 ENCOUNTER — Other Ambulatory Visit: Payer: Self-pay

## 2021-07-30 ENCOUNTER — Emergency Department (HOSPITAL_BASED_OUTPATIENT_CLINIC_OR_DEPARTMENT_OTHER)
Admission: EM | Admit: 2021-07-30 | Discharge: 2021-07-30 | Disposition: A | Discharge status: Home / Self Care | Payer: Medicare HMO | Attending: Emergency Medicine | Admitting: Emergency Medicine

## 2021-07-30 DIAGNOSIS — L03115 Cellulitis of right lower limb: Secondary | ICD-10-CM | POA: Diagnosis not present

## 2021-07-30 DIAGNOSIS — M79661 Pain in right lower leg: Secondary | ICD-10-CM | POA: Diagnosis not present

## 2021-07-30 DIAGNOSIS — R600 Localized edema: Secondary | ICD-10-CM | POA: Insufficient documentation

## 2021-07-30 DIAGNOSIS — M7121 Synovial cyst of popliteal space [Baker], right knee: Secondary | ICD-10-CM | POA: Insufficient documentation

## 2021-07-30 DIAGNOSIS — M7989 Other specified soft tissue disorders: Secondary | ICD-10-CM | POA: Diagnosis present

## 2021-07-30 LAB — BASIC METABOLIC PANEL
Anion gap: 11 (ref 5–15)
BUN: 21 mg/dL (ref 8–23)
CO2: 26 mmol/L (ref 22–32)
Calcium: 9.3 mg/dL (ref 8.9–10.3)
Chloride: 101 mmol/L (ref 98–111)
Creatinine, Ser: 0.71 mg/dL (ref 0.44–1.00)
GFR, Estimated: >60 mL/min (ref >60)
Glucose, Bld: 101 mg/dL — ABNORMAL HIGH (ref 70–99)
Potassium: 3.9 mmol/L (ref 3.5–5.1)
Sodium: 138 mmol/L (ref 135–145)

## 2021-07-30 LAB — CBC WITH DIFFERENTIAL/PLATELET
Abs Immature Granulocytes: 0.03 10*3/uL (ref 0.00–0.07)
Basophils Absolute: 0 10*3/uL (ref 0.0–0.1)
Basophils Relative: 0 %
Eosinophils Absolute: 0 10*3/uL (ref 0.0–0.5)
Eosinophils Relative: 0 %
HCT: 33.2 % — ABNORMAL LOW (ref 36.0–46.0)
Hemoglobin: 10.7 g/dL — ABNORMAL LOW (ref 12.0–15.0)
Immature Granulocytes: 0 %
Lymphocytes Relative: 6 %
Lymphs Abs: 0.7 10*3/uL (ref 0.7–4.0)
MCH: 28.8 pg (ref 26.0–34.0)
MCHC: 32.2 g/dL (ref 30.0–36.0)
MCV: 89.5 fL (ref 80.0–100.0)
Monocytes Absolute: 1.1 10*3/uL — ABNORMAL HIGH (ref 0.1–1.0)
Monocytes Relative: 10 %
Neutro Abs: 9.3 10*3/uL — ABNORMAL HIGH (ref 1.7–7.7)
Neutrophils Relative %: 84 %
Platelets: 306 10*3/uL (ref 150–400)
RBC: 3.71 MIL/uL — ABNORMAL LOW (ref 3.87–5.11)
RDW: 13.3 % (ref 11.5–15.5)
WBC: 11.2 10*3/uL — ABNORMAL HIGH (ref 4.0–10.5)
nRBC: 0 % (ref 0.0–0.2)

## 2021-07-30 MED ORDER — CEPHALEXIN 500 MG PO CAPS
500.0000 mg | ORAL_CAPSULE | Freq: Four times a day (QID) | ORAL | 0 refills | Status: DC
  Filled 2021-07-30: qty 20, 5d supply, fill #0

## 2021-07-30 NOTE — ED Provider Notes (Signed)
[place] EMERGENCY DEPT Provider Note   CSN: [id number] Arrival date & time: 07/30/21  7616     History  Chief Complaint  Patient presents with   Leg Pain    Adriana Peters is a 88 y.o. female.  HPI     88 y/o F here with concern of pain and swelling in her right leg. She noticed the symptoms yesterday. She denies injury to the area. The pain is achy, localized to the right calf, and does not radiate. The pain most commonly occurs with walking. She says that she has had episodes of this in the past but has not seen a doctor for it. Her main concern today is the pain. Her husband reports that she has progressively "slowed down" in the last 6 months. He states that she has needed assistance walking from one room to the next, has had to take a considerable amount of time to walk short distances, and has recently been spending a great deal of time sitting without getting up. She does report multiple falls in the last 6 months but says that she had no injuries. She denies recent surgery, recent travel. Denies tobacco and alcohol use. She denies CP, SOB, fever, HA, dizziness, cough, abd pain, n/v/d.  No history of DVT, PE.  No recent surgeries or long distance travels and no personal history of active cancer.  Home Medications Prior to Admission medications   Medication Sig Start Date End Date Taking? Authorizing Provider  cephALEXin (KEFLEX) 500 MG capsule Take 1 capsule (500 mg total) by mouth 4 (four) times daily. 07/30/21  Yes Varney Biles, MD  thyroid (ARMOUR THYROID) 60 MG tablet TAKE 1 TABLET BY MOUTH EVERY DAY WITH BREAKFAST 07/16/21   [name], [name], MD      Allergies    Patient has no known allergies.    Review of Systems   Review of Systems  All other systems reviewed and are negative.   Physical Exam Updated Vital Signs BP 140/68   Pulse 76   Temp 98.4 F (36.9 C) (Oral)   Resp 16   Ht '4\' 11"'$  (1.499 m)   Wt 46.3 kg   SpO2 100%   BMI 20.60 kg/m   Physical Exam Vitals and nursing note reviewed.  Constitutional:      Appearance: She is well-developed.  HENT:     Head: Atraumatic.  Cardiovascular:     Rate and Rhythm: Normal rate.  Pulmonary:     Effort: Pulmonary effort is normal.  Musculoskeletal:        General: Swelling and tenderness present.     Cervical back: Normal range of motion and neck supple.     Comments: Patient has unilateral right lower extremity swelling with erythema and warmth to touch  Skin:    General: Skin is warm and dry.  Neurological:     Mental Status: She is alert and oriented to person, place, and time.     ED Results / Procedures / Treatments   Labs (all labs ordered are listed, but only abnormal results are displayed) Labs Reviewed  CBC WITH DIFFERENTIAL/PLATELET - Abnormal; Notable for the following components:      Result Value   WBC 11.2 (*)    RBC 3.71 (*)    Hemoglobin 10.7 (*)    HCT 33.2 (*)    Neutro Abs 9.3 (*)    Monocytes Absolute 1.1 (*)    All other components within normal limits  BASIC METABOLIC PANEL -  Abnormal; Notable for the following components:   Glucose, Bld 101 (*)    All other components within normal limits    EKG None  Radiology US Venous Img Lower Right (DVT Study)  Result Date: 07/30/2021 CLINICAL DATA:  Right lower extremity pain and edema the past 2 weeks following fall. Evaluate for DVT. EXAM: RIGHT LOWER EXTREMITY VENOUS DOPPLER ULTRASOUND TECHNIQUE: Gray-scale sonography with graded compression, as well as color Doppler and duplex ultrasound were performed to evaluate the lower extremity deep venous systems from the level of the common femoral vein and including the common femoral, femoral, profunda femoral, popliteal and calf veins including the posterior tibial, peroneal and gastrocnemius veins when visible. The superficial great saphenous vein was also interrogated. Spectral Doppler was utilized to evaluate flow at rest and with distal augmentation  maneuvers in the common femoral, femoral and popliteal veins. COMPARISON:  None Available. FINDINGS: Contralateral Common Femoral Vein: Respiratory phasicity is normal and symmetric with the symptomatic side. No evidence of thrombus. Normal compressibility. Common Femoral Vein: No evidence of thrombus. Normal compressibility, respiratory phasicity and response to augmentation. Saphenofemoral Junction: No evidence of thrombus. Normal compressibility and flow on color Doppler imaging. Profunda Femoral Vein: No evidence of thrombus. Normal compressibility and flow on color Doppler imaging. Femoral Vein: No evidence of thrombus. Normal compressibility, respiratory phasicity and response to augmentation. Popliteal Vein: No evidence of thrombus. Normal compressibility, respiratory phasicity and response to augmentation. Calf Veins: No evidence of thrombus. Normal compressibility and flow on color Doppler imaging. Superficial Great Saphenous Vein: No evidence of thrombus. Normal compressibility. Other Findings: Note is made of an approximately 5.4 x 4.3 x 2.3 cm minimally complex fluid collection with the right popliteal fossa compatible with a Baker's cyst. IMPRESSION: 1. No evidence of DVT within the right lower extremity. 2. Note made of an approximately 5.4 cm minimally complex right-sided Baker's cyst. Electronically Signed   By: Sandi Mariscal M.D.   On: 07/30/2021 13:20    Procedures Procedures    Medications Ordered in ED Medications - No data to display  ED Course/ Medical Decision Making/ A&P                           Medical Decision Making Amount and/or Complexity of Data Reviewed Labs: ordered.  Risk Prescription drug management.   This patient presents to the ED with chief complaint(s) of unilateral right lower extremity swelling with pertinent past medical history of thyroid disease.   She has no history of DVT, PE, and no risk factors for the same.  The differential diagnosis for acute  right unilateral leg swelling includes DVT, cellulitis, Baker's cyst.  Lymphedema and venous stasis also considered.  The initial plan is to ultrasound DVT and basic blood work.   Additional history obtained: Additional history obtained from spouse  Independent labs interpretation:  The following labs were independently interpreted: CBC with slight elevation in white count.  Treatment and Reassessment: Patient's ultrasound DVT is negative for acute DVT. She has Baker's cyst, however the swelling is quite impressive, so I still think cellulitis is quite possible.  We will start her on antibiotics. Patient will follow-up with her PCP in 5 to 7 days for reassessment.    Final Clinical Impression(s) / ED Diagnoses Final diagnoses:  Baker's cyst, right  Leg edema, right  Cellulitis of right lower extremity    Rx / DC Orders ED Discharge Orders  Ordered    cephALEXin (KEFLEX) 500 MG capsule  4 times daily        07/30/21 1358              Varney Biles, MD 07/30/21 1513

## 2021-07-30 NOTE — Discharge Instructions (Addendum)
The ultrasound did not reveal any blood clots.  We did see evidence of a cyst in the back of your knee, which could be contributing to your symptoms.  For now, the plan is to put you on antibiotics for presumed cellulitis of your right leg.  We would like you to follow-up with your primary care doctor in 5 to 7 days.  Consider applying ice to the back of your knee for Baker's cyst.

## 2021-07-30 NOTE — ED Triage Notes (Signed)
Patient reports to the ER for right sided leg pain that starts at the hip and runs down the leg. [place] she had a fall two weeks ago and is unsure if it is related. Patient reports difficulty walking

## 2021-08-02 ENCOUNTER — Other Ambulatory Visit (HOSPITAL_BASED_OUTPATIENT_CLINIC_OR_DEPARTMENT_OTHER): Payer: Self-pay

## 2021-08-02 ENCOUNTER — Telehealth: Payer: Self-pay | Admitting: Family Medicine

## 2021-08-02 DIAGNOSIS — E038 Other specified hypothyroidism: Secondary | ICD-10-CM

## 2021-08-02 NOTE — Telephone Encounter (Signed)
Pt's spouse called to say Pt had a bad infection and was in the hospital and needs a refill of the following:   cephALEXin (KEFLEX) 500 MG capsule  thyroid (ARMOUR THYROID) 60 MG tablet  Please send to:  [id number] [place], [place] [address]:  [phone] Fax:  [phone]    Spouse also stated if he needs to bring Pt in, please call him on [phone].   I asked spouse if he would like to schedule a Hospital FU now, and he said he would call back after he receives her Rx and sees how she does.

## 2021-08-02 NOTE — ED Notes (Signed)
Pt's spouse came to ED requesting a refill on her ABX that was prescribed 07/30/2021. Pt's prescription was for 20 capsules, a 5 day supply. The bottle is empty and the spouse reports her leg is not better and she is having difficulty walking and increase pain.  This RN encouraged spouse to bring pt back to our facility for another evaluation d/t pt worsening symptoms.  Spouse reports he will go home and talk it over "with the boss"

## 2021-08-03 ENCOUNTER — Encounter (HOSPITAL_BASED_OUTPATIENT_CLINIC_OR_DEPARTMENT_OTHER): Payer: Self-pay

## 2021-08-03 ENCOUNTER — Emergency Department (HOSPITAL_BASED_OUTPATIENT_CLINIC_OR_DEPARTMENT_OTHER)
Admission: EM | Admit: 2021-08-03 | Discharge: 2021-08-03 | Disposition: A | Discharge status: Home / Self Care | Payer: Medicare HMO | Attending: Emergency Medicine | Admitting: Emergency Medicine

## 2021-08-03 ENCOUNTER — Other Ambulatory Visit: Payer: Self-pay

## 2021-08-03 DIAGNOSIS — M7121 Synovial cyst of popliteal space [Baker], right knee: Secondary | ICD-10-CM | POA: Insufficient documentation

## 2021-08-03 DIAGNOSIS — M25561 Pain in right knee: Secondary | ICD-10-CM | POA: Diagnosis present

## 2021-08-03 MED ORDER — THYROID 60 MG PO TABS: ORAL_TABLET | ORAL | 0 refills | Status: AC

## 2021-08-03 NOTE — Telephone Encounter (Signed)
Left message to tell patient that she needs an appointment before prescription can be sent. Nothing further needed at this time

## 2021-08-03 NOTE — Telephone Encounter (Addendum)
Needs appointment. Hasn't been seen since 2020.

## 2021-08-03 NOTE — ED Triage Notes (Signed)
"  Back and leg pain making it hard to walk, came here and had medication to last a few days, family doctor was out for the holidays and could not get prescription renewed" per spouse

## 2021-08-03 NOTE — ED Provider Notes (Signed)
[facility] EMERGENCY DEPT Provider Note   CSN: [id number] Arrival date & time: 08/03/21  1241     History  No chief complaint on file.   Adriana Peters is a 88 y.o. female.  Patient here with ongoing right knee pain.  Finished a course of antibiotics.  They have not followed up with primary care doctor or orthopedics.  Nothing makes it worse or better.  History of arthritis, reflux.  States that they were diagnosed with possibly an infection seen here last week.  They are here for possible antibiotic refill.  The history is provided by the patient.       Home Medications Prior to Admission medications   Medication Sig Start Date End Date Taking? Authorizing Provider  cephALEXin (KEFLEX) 500 MG capsule Take 1 capsule (500 mg total) by mouth 4 (four) times daily. 07/30/21   Varney Biles, MD  thyroid [facility] THYROID) 60 MG tablet TAKE 1 TABLET BY MOUTH EVERY DAY WITH BREAKFAST 08/03/21   Peters, Adriana G, MD      Allergies    Patient has no known allergies.    Review of Systems   Review of Systems  Physical Exam Updated Vital Signs BP 130/60 (BP Location: Right Arm)   Pulse 86   Temp 98.5 F (36.9 C)   Resp 18   Ht '4\' 11"'$  (1.499 m)   Wt 46.3 kg   SpO2 98%   BMI 20.60 kg/m  Physical Exam Vitals and nursing note reviewed.  Constitutional:      General: She is not in acute distress.    Appearance: She is well-developed.  HENT:     Head: Normocephalic and atraumatic.  Eyes:     Conjunctiva/sclera: Conjunctivae normal.     Pupils: Pupils are equal, round, and reactive to light.  Cardiovascular:     Rate and Rhythm: Normal rate and regular rhythm.     Pulses: Normal pulses.     Heart sounds: No murmur heard. Pulmonary:     Effort: Pulmonary effort is normal. No respiratory distress.     Breath sounds: Normal breath sounds.  Abdominal:     Palpations: Abdomen is soft.     Tenderness: There is no abdominal tenderness.  Musculoskeletal:         General: Tenderness present. No swelling.     Cervical back: Neck supple.     Comments: Some tenderness to the posterior right knee but there is no major swelling or erythema  Skin:    General: Skin is warm and dry.     Capillary Refill: Capillary refill takes less than 2 seconds.  Neurological:     General: No focal deficit present.     Mental Status: She is alert.     Sensory: No sensory deficit.     Motor: No weakness.  Psychiatric:        Mood and Affect: Mood normal.     ED Results / Procedures / Treatments   Labs (all labs ordered are listed, but only abnormal results are displayed) Labs Reviewed - No data to display  EKG None  Radiology No results found.  Procedures Procedures    Medications Ordered in ED Medications - No data to display  ED Course/ Medical Decision Making/ A&P                           Medical Decision Making  Adriana Peters is here with ongoing right knee pain.  Per chart review she was diagnosed with a Baker's cyst in the right knee several days ago.  Was put on some antibiotics for this as well.  Was referred to orthopedics but she is not aware of that.  Neurovascular neuromuscular she is intact on exam.  Does not much swelling to the lower legs bilaterally.  She still has some tenderness in the right posterior knee.  Suspect Baker's cyst still causing discomfort.  We will have her follow-up with orthopedic for further intervention.  She has normal vitals.  She is well-appearing.  Have no concern for other acute process.  Discharged in good condition.  This chart was dictated using voice recognition software.  Despite best efforts to proofread,  errors can occur which can change the documentation meaning.         Final Clinical Impression(s) / ED Diagnoses Final diagnoses:  Synovial cyst of right popliteal space    Rx / DC Orders ED Discharge Orders     None         Lennice Sites, DO 08/03/21 1353

## 2021-08-06 DIAGNOSIS — M7121 Synovial cyst of popliteal space [Baker], right knee: Secondary | ICD-10-CM | POA: Diagnosis not present

## 2021-08-07 ENCOUNTER — Telehealth: Payer: Self-pay | Admitting: Family Medicine

## 2021-08-07 NOTE — Telephone Encounter (Signed)
Pt was called at 2:03 pm on 08/07/2021 to schedule for a Hospital FU/medication refill request.  No answer. Voicemail left for Pt to call office to schedule OV.

## 2021-08-08 NOTE — Telephone Encounter (Signed)
Pt was called at 2:03 pm on 08/07/2021 to schedule for a Hospital FU/medication refill request.   No answer. Voicemail left for Pt to call office to schedule OV.

## 2021-08-14 ENCOUNTER — Emergency Department (HOSPITAL_COMMUNITY)
Admission: EM | Admit: 2021-08-14 | Discharge: 2021-08-14 | Disposition: A | Discharge status: Home / Self Care | Payer: Medicare HMO | Attending: Emergency Medicine | Admitting: Emergency Medicine

## 2021-08-14 ENCOUNTER — Other Ambulatory Visit: Payer: Self-pay

## 2021-08-14 ENCOUNTER — Encounter (HOSPITAL_COMMUNITY): Payer: Self-pay

## 2021-08-14 ENCOUNTER — Emergency Department (HOSPITAL_COMMUNITY): Payer: Medicare HMO

## 2021-08-14 DIAGNOSIS — E039 Hypothyroidism, unspecified: Secondary | ICD-10-CM | POA: Insufficient documentation

## 2021-08-14 DIAGNOSIS — Z79899 Other long term (current) drug therapy: Secondary | ICD-10-CM | POA: Diagnosis not present

## 2021-08-14 DIAGNOSIS — S41112A Laceration without foreign body of left upper arm, initial encounter: Secondary | ICD-10-CM | POA: Diagnosis not present

## 2021-08-14 DIAGNOSIS — M79606 Pain in leg, unspecified: Secondary | ICD-10-CM | POA: Diagnosis not present

## 2021-08-14 DIAGNOSIS — M79604 Pain in right leg: Secondary | ICD-10-CM | POA: Insufficient documentation

## 2021-08-14 DIAGNOSIS — R55 Syncope and collapse: Secondary | ICD-10-CM | POA: Diagnosis not present

## 2021-08-14 DIAGNOSIS — X58XXXA Exposure to other specified factors, initial encounter: Secondary | ICD-10-CM | POA: Diagnosis not present

## 2021-08-14 DIAGNOSIS — I10 Essential (primary) hypertension: Secondary | ICD-10-CM | POA: Insufficient documentation

## 2021-08-14 DIAGNOSIS — S40922A Unspecified superficial injury of left upper arm, initial encounter: Secondary | ICD-10-CM | POA: Diagnosis present

## 2021-08-14 DIAGNOSIS — D72829 Elevated white blood cell count, unspecified: Secondary | ICD-10-CM | POA: Diagnosis not present

## 2021-08-14 DIAGNOSIS — R9431 Abnormal electrocardiogram [ECG] [EKG]: Secondary | ICD-10-CM | POA: Diagnosis not present

## 2021-08-14 LAB — CBC WITH DIFFERENTIAL/PLATELET
Abs Immature Granulocytes: 0.05 10*3/uL (ref 0.00–0.07)
Basophils Absolute: 0 10*3/uL (ref 0.0–0.1)
Basophils Relative: 0 %
Eosinophils Absolute: 0.1 10*3/uL (ref 0.0–0.5)
Eosinophils Relative: 1 %
HCT: 35.1 % — ABNORMAL LOW (ref 36.0–46.0)
Hemoglobin: 11.3 g/dL — ABNORMAL LOW (ref 12.0–15.0)
Immature Granulocytes: 1 %
Lymphocytes Relative: 8 %
Lymphs Abs: 0.9 10*3/uL (ref 0.7–4.0)
MCH: 29.5 pg (ref 26.0–34.0)
MCHC: 32.2 g/dL (ref 30.0–36.0)
MCV: 91.6 fL (ref 80.0–100.0)
Monocytes Absolute: 0.9 10*3/uL (ref 0.1–1.0)
Monocytes Relative: 8 %
Neutro Abs: 8.8 10*3/uL — ABNORMAL HIGH (ref 1.7–7.7)
Neutrophils Relative %: 82 %
Platelets: 450 10*3/uL — ABNORMAL HIGH (ref 150–400)
RBC: 3.83 MIL/uL — ABNORMAL LOW (ref 3.87–5.11)
RDW: 13.5 % (ref 11.5–15.5)
WBC: 10.7 10*3/uL — ABNORMAL HIGH (ref 4.0–10.5)
nRBC: 0 % (ref 0.0–0.2)

## 2021-08-14 LAB — COMPREHENSIVE METABOLIC PANEL
ALT: 22 U/L (ref 0–44)
AST: 26 U/L (ref 15–41)
Albumin: 3.1 g/dL — ABNORMAL LOW (ref 3.5–5.0)
Alkaline Phosphatase: 126 U/L (ref 38–126)
Anion gap: 6 (ref 5–15)
BUN: 18 mg/dL (ref 8–23)
CO2: 24 mmol/L (ref 22–32)
Calcium: 8.3 mg/dL — ABNORMAL LOW (ref 8.9–10.3)
Chloride: 109 mmol/L (ref 98–111)
Creatinine, Ser: 0.56 mg/dL (ref 0.44–1.00)
GFR, Estimated: >60 mL/min (ref >60)
Glucose, Bld: 94 mg/dL (ref 70–99)
Potassium: 3.7 mmol/L (ref 3.5–5.1)
Sodium: 139 mmol/L (ref 135–145)
Total Bilirubin: 0.6 mg/dL (ref 0.3–1.2)
Total Protein: 6.5 g/dL (ref 6.5–8.1)

## 2021-08-14 LAB — TSH: TSH: 3.046 u[IU]/mL (ref 0.350–4.500)

## 2021-08-14 LAB — T4, FREE: Free T4: 0.88 ng/dL (ref 0.61–1.12)

## 2021-08-14 NOTE — Discharge Instructions (Addendum)
Note that your work-up today was negative for any acute medical abnormality.  Although you declined to get in contact with social work on the emergency department, note that you can always come back for future contact with social work.  Contact your primary care provider or see the social work information attached to reach out with if you ever decide you need more at home attention.  Please not hesitate to return to the emergency department for worrisome signs symptoms we discussed become apparent.

## 2021-08-14 NOTE — ED Provider Notes (Signed)
[facility] Provider Note   CSN: [id number] Arrival date & time: 08/14/21  1308     History  Chief Complaint  Patient presents with  . Leg Pain    Adriana Peters is a 88 y.o. female.   Leg Pain Associated symptoms: no back pain and no fever    88 year old female presents emergency department with complaints of right leg pain and skin tear on left arm.  Per husband, patient has short-term memory loss which has been apparent for the past few years and remains unchanged in nature.  She is unaware of details around events that happened.  She cannot recall mechanism of injury of right knee but states that it hurts.  She also cannot recall events around skin tear to left arm but notes that it has been using and that it is bruised.  She is accompanied by her husband who is primary historian.  Husband states that patient has been increasingly unstable when walking since the first week of July.  She was seen at the emergency department on 07/30/2021 for right knee pain with noticeable edema on presentation.  She was diagnosed with antibiotics which she completed its course.  She was then seen again on 08/03/2021 for similar right knee pain with swelling in popliteal fossa.  She was given orthopedic follow-up then.  She felt through the orthopedic follow-up who offered drainage of right knee but patient declined.  Husband states that since noticing patient being more unstable, she has fallen more frequently.  He notes during the month she fell 3 times within 24 hours.  Her most recent fall was yesterday.  Falls have been  unwitnessed by husband. Patient cannot recall events around fall last night and does not remember some of the falls entirely.  Denies known trauma to head, changes in vision, chest pain, shortness of breath, abdominal pain, nausea/vomiting/diarrhea, urinary/vaginal symptoms, change in bowel habits, blood thinner use.  Patient cannot recall syncopal or presyncopal  symptoms.  She notes she has not lost consciousness during falls.  Patient has a history of hypothyroidism for which she is on Armour Thyroid.  She lives at home with husband who feels strained by taking care of her and is requesting additional evaluation through social work.  Home Medications Prior to Admission medications   Medication Sig Start Date End Date Taking? Authorizing Provider  thyroid (ARMOUR THYROID) 60 MG tablet TAKE 1 TABLET BY MOUTH EVERY DAY WITH BREAKFAST 08/03/21  Yes [name], [name], MD  cephALEXin (KEFLEX) 500 MG capsule Take 1 capsule (500 mg total) by mouth 4 (four) times daily. Patient not taking: Reported on 08/14/2021 07/30/21   Varney Biles, MD      Allergies    Patient has no known allergies.    Review of Systems   Review of Systems  Constitutional:  Negative for chills and fever.  HENT:  Negative for ear pain and sore throat.   Eyes:  Negative for pain and visual disturbance.  Respiratory:  Negative for cough and shortness of breath.   Cardiovascular:  Negative for chest pain and palpitations.  Gastrointestinal:  Negative for abdominal pain and vomiting.  Genitourinary:  Negative for dysuria and hematuria.  Musculoskeletal:  Negative for arthralgias and back pain.       Right knee pain  Skin:  Negative for color change and rash.       Skin tear left arm  Neurological:  Negative for seizures and syncope.  All other systems reviewed and  are negative.   Physical Exam Updated Vital Signs BP (!) 153/73   Pulse 68   Temp 98.7 F (37.1 C)   Resp 18   Ht '4\' 11"'$  (1.499 m)   Wt 46.3 kg   SpO2 97%   BMI 20.60 kg/m  Physical Exam Vitals and nursing note reviewed.  Constitutional:      General: She is not in acute distress.    Appearance: She is well-developed.  HENT:     Head: Normocephalic and atraumatic.     Right Ear: Tympanic membrane normal.     Left Ear: Tympanic membrane normal.     Nose: Nose normal.     Mouth/Throat:     Mouth: Mucous  membranes are moist.     Pharynx: Oropharynx is clear.  Eyes:     General:        Right eye: No discharge.        Left eye: No discharge.     Extraocular Movements: Extraocular movements intact.     Conjunctiva/sclera: Conjunctivae normal.     Pupils: Pupils are equal, round, and reactive to light.  Cardiovascular:     Rate and Rhythm: Normal rate and regular rhythm.     Heart sounds: Murmur heard.  Pulmonary:     Effort: Pulmonary effort is normal. No respiratory distress.     Breath sounds: Normal breath sounds.  Abdominal:     Palpations: Abdomen is soft.     Tenderness: There is no abdominal tenderness.  Musculoskeletal:        General: No swelling. Normal range of motion.     Cervical back: Normal range of motion and neck supple. No rigidity.     Right lower leg: No edema.     Left lower leg: No edema.     Comments: Minimal swelling noticed in patient's right popliteal fossa.  No tenderness to palpation along medial or lateral joint line.  Patient has full active range of motion of lower extremities with no pain.  Posterior tibial pulses full and intact bilaterally.  Skin:    General: Skin is warm and dry.     Capillary Refill: Capillary refill takes less than 2 seconds.  Neurological:     Mental Status: She is alert.     Comments: Cranial nerves III through XII grossly intact.  Patient moves all 4 extremities with no difficulty.  Patient complains of no sensory deficits along major nerve distributions of upper or lower extremities.  Muscle strength symmetric and equal bilaterally.  Psychiatric:        Mood and Affect: Mood normal.     ED Results / Procedures / Treatments   Labs (all labs ordered are listed, but only abnormal results are displayed) Labs Reviewed  COMPREHENSIVE METABOLIC PANEL - Abnormal; Notable for the following components:      Result Value   Calcium 8.3 (*)    Albumin 3.1 (*)    All other components within normal limits  CBC WITH  DIFFERENTIAL/PLATELET - Abnormal; Notable for the following components:   WBC 10.7 (*)    RBC 3.83 (*)    Hemoglobin 11.3 (*)    HCT 35.1 (*)    Platelets 450 (*)    Neutro Abs 8.8 (*)    All other components within normal limits  TSH  T4, FREE  URINALYSIS, ROUTINE W REFLEX MICROSCOPIC    EKG None  Radiology CT Head Wo Contrast  Result Date: 08/14/2021 CLINICAL DATA:  Near syncopal episode EXAM:  CT HEAD WITHOUT CONTRAST TECHNIQUE: Contiguous axial images were obtained from the base of the skull through the vertex without intravenous contrast. RADIATION DOSE REDUCTION: This exam was performed according to the departmental dose-optimization program which includes automated exposure control, adjustment of the mA and/or kV according to patient size and/or use of iterative reconstruction technique. COMPARISON:  None Available. FINDINGS: Brain: No evidence of acute infarction, hemorrhage, cerebral edema, mass, mass effect, or midline shift. No hydrocephalus or extra-axial fluid collection. Periventricular white matter changes, likely the sequela of chronic small vessel ischemic disease. Cerebral volume is within normal limits for age. Vascular: No hyperdense vessel. Skull: Normal. Negative for fracture or focal lesion. Sinuses/Orbits: No acute finding. Postsurgical changes in the bilateral globes. Other: The mastoid air cells are well aerated. IMPRESSION: No acute intracranial process. Electronically Signed   By: Merilyn Baba M.D.   On: 08/14/2021 16:17    Procedures Procedures    Medications Ordered in ED Medications - No data to display  ED Course/ Medical Decision Making/ A&P Clinical Course as of 08/15/21 0050  Tue Aug 14, 2021  2151 Upon reevaluation of the patient, she elected to not be seen by social work regarding getting further assistance at home or for potential SNF placement.  Husband was in agreement with said plan.  She like to be discharged and will pursue further resources  outpatient.  There was a clear miscommunication between patient, husband and myself upon initial interview regarding patient's desire for SNF/extra help through at home nursing. [CR]    Clinical Course User Index [CR] Wilnette Kales, PA                           Medical Decision Making Amount and/or Complexity of Data Reviewed Labs: ordered. Radiology: ordered.   This patient presents to the ED for concern of right knee pain, left arm skin tear, recurrent falls, this involves an extensive number of treatment options, and is a complaint that carries with it a high risk of complications and morbidity.  The differential diagnosis includes intracranial hemorrhage, electrolyte abnormality, arrhythmia, cellulitis, erysipelas, fracture, sprain/strain, sepsis   Co morbidities that complicate the patient evaluation  Thyroid disease, osteoporosis, arthritis, GERD   Additional history obtained:  Additional history obtained from husband who is at bedside External records from outside source obtained and reviewed including negative DVT study from 07/30/2021 with positive findings for right-sided Baker's cyst   Lab Tests:  I Ordered, and personally interpreted labs.  The pertinent results include: Mild leukocytosis of 10.7 with no left shift.  Hemoglobin 11.3   Imaging Studies ordered:  I ordered imaging studies including CT head without contrast I independently visualized and interpreted imaging which showed no acute intracranial abnormality I agree with the radiologist interpretation   Cardiac Monitoring: / EKG:  The patient was maintained on a cardiac monitor.  I personally viewed and interpreted the cardiac monitored which showed an underlying rhythm of: Sinus rhythm   Consultations Obtained:  TOC was consulted regarding the patient, but the patient elected to go home over consultation of TOC  Problem List / ED Course / Critical interventions / Medication management  Recurrent  falls Reevaluation of the patient s showed that the patient improved I have reviewed the patients home medicines and have made adjustments as needed   Social Determinants of Health:  Patient lives at home with only her 6 year old husband to take care of her.   Test / Admission -  Considered:  Right leg pain/recurrent falls Vitals signs significant for hypertension with a blood pressure 153/73.  Recommend close follow-up with PCP regarding blood pressure. Otherwise within normal range and stable throughout visit. Laboratory/imaging studies significant for: No acute abnormalities No direct cause as to why patient is having recurrent falls.  She denies syncope or presyncopal type symptoms.  Could be due to gait unsteadiness or repetitive mechanical falls.  Given frail nature of patient as well as frail nature of spouse, TOC was consulted of what was thought was a mutual desire of both patient, husband and provider for further care of patient.  Upon reevaluation of the patient as depicted in ED course, patient elected to leave [facility] and not be evaluated by social work.  Risks and benefits of being discharged without thorough evaluation were discussed with patient and husband and they nausea or standing.  Outpatient resources were offered to patient and husband if their minds changed for at home nurse or potential nursing home placement in the future.  Patient has been kindly refused said offer and elected to leave.  Patient was deemed to have capacity to make medical decisions for herself. Worrisome signs and symptoms were discussed with the patient, and the patient acknowledged understanding to return to the ED if noticed. Patient was stable upon discharge.          Final Clinical Impression(s) / ED Diagnoses Final diagnoses:  Right leg pain  Skin tear of left upper arm without complication, initial encounter    Rx / DC Orders ED Discharge Orders     None          Wilnette Kales, [place] 08/15/21 0050    Milton Ferguson, MD 08/16/21 1137

## 2021-08-14 NOTE — ED Triage Notes (Signed)
"  Pain on right lateral leg, has been diagnosed with a cyst and had it offered to be drained and she declined, but now when she stands it is very painful and she wants it checked again, she also has a skin tear to left upper upper arm with redness around it, but it is not painful" per EMS

## 2021-08-14 NOTE — ED Notes (Signed)
Assisted patient to bathroom to void. Patient missed toilet hat and was unable to collect urine specimen

## 2021-08-14 NOTE — ED Notes (Signed)
Assisted to bathroom with wheel chair. Patient unable to ambulate on her own. Able to stand and pit with two person assist.

## 2022-04-06 ENCOUNTER — Emergency Department (HOSPITAL_COMMUNITY): Payer: Medicare HMO

## 2022-04-06 ENCOUNTER — Other Ambulatory Visit: Payer: Self-pay

## 2022-04-06 ENCOUNTER — Encounter (HOSPITAL_COMMUNITY): Payer: Self-pay | Admitting: *Deleted

## 2022-04-06 ENCOUNTER — Inpatient Hospital Stay (HOSPITAL_COMMUNITY)
Admission: EM | Admit: 2022-04-06 | Discharge: 2022-04-08 | DRG: 372 | Disposition: A | Discharge status: Home / Self Care | Payer: Medicare HMO | Attending: Internal Medicine | Admitting: Internal Medicine

## 2022-04-06 DIAGNOSIS — K6389 Other specified diseases of intestine: Secondary | ICD-10-CM | POA: Diagnosis not present

## 2022-04-06 DIAGNOSIS — M810 Age-related osteoporosis without current pathological fracture: Secondary | ICD-10-CM | POA: Diagnosis present

## 2022-04-06 DIAGNOSIS — I959 Hypotension, unspecified: Secondary | ICD-10-CM | POA: Diagnosis not present

## 2022-04-06 DIAGNOSIS — E039 Hypothyroidism, unspecified: Secondary | ICD-10-CM | POA: Diagnosis present

## 2022-04-06 DIAGNOSIS — R531 Weakness: Secondary | ICD-10-CM

## 2022-04-06 DIAGNOSIS — W19XXXA Unspecified fall, initial encounter: Secondary | ICD-10-CM | POA: Diagnosis present

## 2022-04-06 DIAGNOSIS — G8929 Other chronic pain: Secondary | ICD-10-CM | POA: Diagnosis not present

## 2022-04-06 DIAGNOSIS — M25551 Pain in right hip: Secondary | ICD-10-CM | POA: Diagnosis not present

## 2022-04-06 DIAGNOSIS — E860 Dehydration: Secondary | ICD-10-CM | POA: Diagnosis not present

## 2022-04-06 DIAGNOSIS — R410 Disorientation, unspecified: Secondary | ICD-10-CM | POA: Diagnosis not present

## 2022-04-06 DIAGNOSIS — F0390 Unspecified dementia without behavioral disturbance: Secondary | ICD-10-CM | POA: Diagnosis not present

## 2022-04-06 DIAGNOSIS — R4182 Altered mental status, unspecified: Secondary | ICD-10-CM | POA: Diagnosis not present

## 2022-04-06 DIAGNOSIS — E878 Other disorders of electrolyte and fluid balance, not elsewhere classified: Secondary | ICD-10-CM | POA: Diagnosis not present

## 2022-04-06 DIAGNOSIS — M1990 Unspecified osteoarthritis, unspecified site: Secondary | ICD-10-CM | POA: Diagnosis present

## 2022-04-06 DIAGNOSIS — E871 Hypo-osmolality and hyponatremia: Secondary | ICD-10-CM | POA: Diagnosis not present

## 2022-04-06 DIAGNOSIS — R0602 Shortness of breath: Secondary | ICD-10-CM | POA: Diagnosis not present

## 2022-04-06 DIAGNOSIS — E876 Hypokalemia: Secondary | ICD-10-CM | POA: Diagnosis present

## 2022-04-06 DIAGNOSIS — A0472 Enterocolitis due to Clostridium difficile, not specified as recurrent: Secondary | ICD-10-CM | POA: Diagnosis not present

## 2022-04-06 DIAGNOSIS — M545 Low back pain, unspecified: Secondary | ICD-10-CM | POA: Diagnosis present

## 2022-04-06 DIAGNOSIS — M25552 Pain in left hip: Secondary | ICD-10-CM | POA: Diagnosis not present

## 2022-04-06 DIAGNOSIS — K529 Noninfective gastroenteritis and colitis, unspecified: Principal | ICD-10-CM | POA: Diagnosis present

## 2022-04-06 DIAGNOSIS — Z9181 History of falling: Secondary | ICD-10-CM

## 2022-04-06 DIAGNOSIS — Z79890 Hormone replacement therapy (postmenopausal): Secondary | ICD-10-CM | POA: Diagnosis not present

## 2022-04-06 DIAGNOSIS — M1711 Unilateral primary osteoarthritis, right knee: Secondary | ICD-10-CM | POA: Diagnosis not present

## 2022-04-06 DIAGNOSIS — R5381 Other malaise: Secondary | ICD-10-CM | POA: Diagnosis not present

## 2022-04-06 DIAGNOSIS — E872 Acidosis, unspecified: Secondary | ICD-10-CM | POA: Diagnosis not present

## 2022-04-06 DIAGNOSIS — R0902 Hypoxemia: Secondary | ICD-10-CM | POA: Diagnosis not present

## 2022-04-06 DIAGNOSIS — K449 Diaphragmatic hernia without obstruction or gangrene: Secondary | ICD-10-CM | POA: Diagnosis present

## 2022-04-06 DIAGNOSIS — K219 Gastro-esophageal reflux disease without esophagitis: Secondary | ICD-10-CM | POA: Diagnosis present

## 2022-04-06 DIAGNOSIS — Z743 Need for continuous supervision: Secondary | ICD-10-CM | POA: Diagnosis not present

## 2022-04-06 DIAGNOSIS — M47812 Spondylosis without myelopathy or radiculopathy, cervical region: Secondary | ICD-10-CM | POA: Diagnosis not present

## 2022-04-06 DIAGNOSIS — M1712 Unilateral primary osteoarthritis, left knee: Secondary | ICD-10-CM | POA: Diagnosis not present

## 2022-04-06 DIAGNOSIS — R609 Edema, unspecified: Secondary | ICD-10-CM | POA: Diagnosis not present

## 2022-04-06 DIAGNOSIS — R001 Bradycardia, unspecified: Secondary | ICD-10-CM | POA: Diagnosis not present

## 2022-04-06 LAB — CBC WITH DIFFERENTIAL/PLATELET
Abs Immature Granulocytes: 0.36 10*3/uL — ABNORMAL HIGH (ref 0.00–0.07)
Basophils Absolute: 0.1 10*3/uL (ref 0.0–0.1)
Basophils Relative: 0 %
Eosinophils Absolute: 0 10*3/uL (ref 0.0–0.5)
Eosinophils Relative: 0 %
HCT: 37.4 % (ref 36.0–46.0)
Hemoglobin: 12.5 g/dL (ref 12.0–15.0)
Immature Granulocytes: 1 %
Lymphocytes Relative: 3 %
Lymphs Abs: 0.9 10*3/uL (ref 0.7–4.0)
MCH: 29.6 pg (ref 26.0–34.0)
MCHC: 33.4 g/dL (ref 30.0–36.0)
MCV: 88.4 fL (ref 80.0–100.0)
Monocytes Absolute: 1.6 10*3/uL — ABNORMAL HIGH (ref 0.1–1.0)
Monocytes Relative: 5 %
Neutro Abs: 30 10*3/uL — ABNORMAL HIGH (ref 1.7–7.7)
Neutrophils Relative %: 91 %
Platelets: 551 10*3/uL — ABNORMAL HIGH (ref 150–400)
RBC: 4.23 MIL/uL (ref 3.87–5.11)
RDW: 14.4 % (ref 11.5–15.5)
WBC: 33 10*3/uL — ABNORMAL HIGH (ref 4.0–10.5)
nRBC: 0 % (ref 0.0–0.2)

## 2022-04-06 LAB — COMPREHENSIVE METABOLIC PANEL
ALT: 35 U/L (ref 0–44)
AST: 46 U/L — ABNORMAL HIGH (ref 15–41)
Albumin: 2.5 g/dL — ABNORMAL LOW (ref 3.5–5.0)
Alkaline Phosphatase: 92 U/L (ref 38–126)
Anion gap: 13 (ref 5–15)
BUN: 24 mg/dL — ABNORMAL HIGH (ref 8–23)
CO2: 20 mmol/L — ABNORMAL LOW (ref 22–32)
Calcium: 7.8 mg/dL — ABNORMAL LOW (ref 8.9–10.3)
Chloride: 97 mmol/L — ABNORMAL LOW (ref 98–111)
Creatinine, Ser: 0.86 mg/dL (ref 0.44–1.00)
GFR, Estimated: >60 mL/min (ref >60)
Glucose, Bld: 122 mg/dL — ABNORMAL HIGH (ref 70–99)
Potassium: 3.7 mmol/L (ref 3.5–5.1)
Sodium: 130 mmol/L — ABNORMAL LOW (ref 135–145)
Total Bilirubin: 1.1 mg/dL (ref 0.3–1.2)
Total Protein: 5.6 g/dL — ABNORMAL LOW (ref 6.5–8.1)

## 2022-04-06 LAB — URINALYSIS, W/ REFLEX TO CULTURE (INFECTION SUSPECTED)
Bacteria, UA: NONE SEEN
Bilirubin Urine: NEGATIVE
Glucose, UA: NEGATIVE mg/dL
Hgb urine dipstick: NEGATIVE
Ketones, ur: 20 mg/dL — AB
Leukocytes,Ua: NEGATIVE
Nitrite: NEGATIVE
Protein, ur: NEGATIVE mg/dL
Specific Gravity, Urine: 1.029 (ref 1.005–1.030)
pH: 5 (ref 5.0–8.0)

## 2022-04-06 LAB — MAGNESIUM: Magnesium: 1.9 mg/dL (ref 1.7–2.4)

## 2022-04-06 LAB — PROTIME-INR
INR: 1.1 (ref 0.8–1.2)
Prothrombin Time: 13.9 seconds (ref 11.4–15.2)

## 2022-04-06 LAB — BRAIN NATRIURETIC PEPTIDE: B Natriuretic Peptide: 184 pg/mL — ABNORMAL HIGH (ref 0.0–100.0)

## 2022-04-06 LAB — C DIFFICILE QUICK SCREEN W PCR REFLEX
C Diff antigen: POSITIVE — AB
C Diff interpretation: DETECTED
C Diff toxin: POSITIVE — AB

## 2022-04-06 LAB — TROPONIN I (HIGH SENSITIVITY): Troponin I (High Sensitivity): 10 ng/L (ref <18)

## 2022-04-06 LAB — TSH: TSH: 7.514 u[IU]/mL — ABNORMAL HIGH (ref 0.350–4.500)

## 2022-04-06 MED ORDER — CALCIUM CARBONATE ANTACID 500 MG PO CHEW
400.0000 mg | CHEWABLE_TABLET | Freq: Two times a day (BID) | ORAL | Status: DC
  Administered 2022-04-06 – 2022-04-08 (×4): 400 mg via ORAL
  Filled 2022-04-06 (×4): qty 2

## 2022-04-06 MED ORDER — NYSTATIN-TRIAMCINOLONE 100000-0.1 UNIT/GM-% EX CREA
TOPICAL_CREAM | Freq: Two times a day (BID) | CUTANEOUS | Status: DC
  Administered 2022-04-07: 1 via TOPICAL
  Filled 2022-04-06 (×2): qty 15

## 2022-04-06 MED ORDER — PIPERACILLIN-TAZOBACTAM 3.375 G IVPB 30 MIN: 3.3750 g | Freq: Once | INTRAVENOUS | Status: DC

## 2022-04-06 MED ORDER — METRONIDAZOLE 500 MG/100ML IV SOLN
500.0000 mg | Freq: Two times a day (BID) | INTRAVENOUS | Status: AC
  Administered 2022-04-06: 500 mg via INTRAVENOUS
  Filled 2022-04-06: qty 100

## 2022-04-06 MED ORDER — ONDANSETRON HCL 4 MG/2ML IJ SOLN: 4.0000 mg | Freq: Four times a day (QID) | INTRAMUSCULAR | Status: DC | PRN

## 2022-04-06 MED ORDER — HEPARIN SODIUM (PORCINE) 5000 UNIT/ML IJ SOLN
5000.0000 [IU] | Freq: Three times a day (TID) | INTRAMUSCULAR | Status: DC
  Administered 2022-04-06 – 2022-04-08 (×4): 5000 [IU] via SUBCUTANEOUS
  Filled 2022-04-06 (×6): qty 1

## 2022-04-06 MED ORDER — FAMOTIDINE 20 MG PO TABS
10.0000 mg | ORAL_TABLET | Freq: Every day | ORAL | Status: DC
  Administered 2022-04-06 – 2022-04-07 (×2): 10 mg via ORAL
  Filled 2022-04-06 (×2): qty 1

## 2022-04-06 MED ORDER — VANCOMYCIN HCL 125 MG PO CAPS
125.0000 mg | ORAL_CAPSULE | Freq: Four times a day (QID) | ORAL | Status: DC
  Administered 2022-04-06 – 2022-04-08 (×10): 125 mg via ORAL
  Filled 2022-04-06 (×17): qty 1

## 2022-04-06 MED ORDER — SODIUM CHLORIDE 0.9 % IV SOLN: INTRAVENOUS | Status: DC

## 2022-04-06 MED ORDER — IOHEXOL 300 MG/ML  SOLN
75.0000 mL | Freq: Once | INTRAMUSCULAR | Status: AC | PRN
  Administered 2022-04-06: 75 mL via INTRAVENOUS

## 2022-04-06 MED ORDER — SODIUM CHLORIDE 0.9% FLUSH
3.0000 mL | Freq: Two times a day (BID) | INTRAVENOUS | Status: DC
  Administered 2022-04-06 – 2022-04-08 (×4): 3 mL via INTRAVENOUS

## 2022-04-06 MED ORDER — TRAMADOL HCL 50 MG PO TABS: 50.0000 mg | ORAL_TABLET | Freq: Two times a day (BID) | ORAL | Status: DC | PRN

## 2022-04-06 MED ORDER — ACETAMINOPHEN 650 MG RE SUPP: 650.0000 mg | Freq: Four times a day (QID) | RECTAL | Status: DC | PRN

## 2022-04-06 MED ORDER — TRAZODONE HCL 50 MG PO TABS: 50.0000 mg | ORAL_TABLET | Freq: Every evening | ORAL | Status: DC | PRN

## 2022-04-06 MED ORDER — FAMOTIDINE 20 MG PO TABS: 20.0000 mg | ORAL_TABLET | Freq: Two times a day (BID) | ORAL | Status: DC

## 2022-04-06 MED ORDER — ONDANSETRON HCL 4 MG PO TABS: 4.0000 mg | ORAL_TABLET | Freq: Four times a day (QID) | ORAL | Status: DC | PRN

## 2022-04-06 MED ORDER — THYROID 30 MG PO TABS
60.0000 mg | ORAL_TABLET | Freq: Every day | ORAL | Status: DC
  Administered 2022-04-06 – 2022-04-08 (×3): 60 mg via ORAL
  Filled 2022-04-06 (×3): qty 2

## 2022-04-06 MED ORDER — SODIUM CHLORIDE 0.9 % IV SOLN
2.0000 g | INTRAVENOUS | Status: DC
  Administered 2022-04-06: 2 g via INTRAVENOUS
  Filled 2022-04-06: qty 20

## 2022-04-06 MED ORDER — SODIUM CHLORIDE 0.9% FLUSH
3.0000 mL | Freq: Two times a day (BID) | INTRAVENOUS | Status: DC
  Administered 2022-04-07 – 2022-04-08 (×3): 3 mL via INTRAVENOUS

## 2022-04-06 MED ORDER — SODIUM CHLORIDE 0.9 % IV SOLN: INTRAVENOUS | Status: DC | PRN

## 2022-04-06 MED ORDER — LORAZEPAM 0.5 MG PO TABS: 0.5000 mg | ORAL_TABLET | Freq: Two times a day (BID) | ORAL | Status: DC | PRN

## 2022-04-06 MED ORDER — ACETAMINOPHEN 325 MG PO TABS: 650.0000 mg | ORAL_TABLET | Freq: Four times a day (QID) | ORAL | Status: DC | PRN

## 2022-04-06 MED ORDER — SODIUM CHLORIDE 0.9% FLUSH: 3.0000 mL | INTRAVENOUS | Status: DC | PRN

## 2022-04-06 NOTE — ED Triage Notes (Addendum)
Pt brought in from home by [facility] with c/o AMS. Husband reported to EMS that pt's mental status has progressively been getting worse over the last 8 months. Over the last 2 weeks her BLE have been swelling. Pt is incontinent and husband reported to EMS that he is unable to care for her anymore at home. Pt had a fall 2 weeks ago and has a wound to her right forehead. Pt covered in dried feces upon arrival to ED. Pt alert and oriented to self and place, not time or situation.

## 2022-04-06 NOTE — ED Provider Notes (Signed)
[facility] Provider Note   CSN: [id number] Arrival date & time: 04/06/22  [id number]     History  No chief complaint on file.   Adriana Peters is a 89 y.o. female.  HPI   This patient is a 89 year old female arriving by paramedic transport because of increasing weakness.  According to the paramedics who are the primary historians the patient lives with her 44 year old husband who called the paramedics this morning because the patient has been persistently generally weak getting worse and is now having significant amounts of incontinence, he states he is doing laundry 5 or 6 times per day and she is unable to hold her bowels or bladder.  Unfortunately the husband is not available by phone either at home phone or cell phone to answer questions at this time as I have tried.  Paramedics report vital signs were unremarkable otherwise.  The patient has no complaints, she understands that she is at the hospital and states she is here because her husband sent her, she does not know any other answers to questions at this time.  There was concern expressed by the paramedics that the husband stated that she was having some altered mental status and confusion though it is not clear exactly what that entails  The paramedics did report that there had been a fall a couple of weeks ago, the patient was not sent to the emergency department for evaluation after that fall.  During that time there was a head injury.  Today the patient was found beside the bed sitting down weak and not able to get up.  The last notes that I have on record from a family doctor are from 2020, the patient had a history of acid reflux and hypothyroidism supposed to take thyroid medications.  She was on Armour Thyroid at that time TSH at that time was [id number] and metabolic function was normal.  Home Medications Prior to Admission medications   Medication Sig Start Date End Date Taking? Authorizing  Provider  cephALEXin (KEFLEX) 500 MG capsule Take 1 capsule (500 mg total) by mouth 4 (four) times daily. Patient not taking: Reported on 08/14/2021 07/30/21   Varney Biles, MD  thyroid [facility] THYROID) 60 MG tablet TAKE 1 TABLET BY MOUTH EVERY DAY WITH BREAKFAST 08/03/21   [name], [name], MD      Allergies    Patient has no known allergies.    Review of Systems   Review of Systems  Unable to perform ROS: Mental status change    Physical Exam Updated Vital Signs BP (!) 142/69 (BP Location: Right Arm)   Pulse 99   Temp 98.5 F (36.9 C) (Oral)   Resp 16   SpO2 100%  Physical Exam Constitutional:      Comments: Awake and alert, able to follow commands though appears generally weak, no obvious facial droop, speech is clear  HENT:     Head: Normocephalic.     Comments: Signs of bruise to the right side of the forehead with some scabbing and some periorbital ecchymosis on the right side    Nose: Nose normal.     Mouth/Throat:     Mouth: Mucous membranes are moist.  Eyes:     Pupils: Pupils are equal, round, and reactive to light.     Comments: Periorbital bruising on the right, extraocular movements intact  Neck:     Comments: No posterior cervical tenderness Cardiovascular:     Rate and  Rhythm: Normal rate and regular rhythm.  Pulmonary:     Effort: Pulmonary effort is normal. No respiratory distress.     Breath sounds: No wheezing or rales.  Abdominal:     Comments: Abdomen is soft and nontender  Genitourinary:    Comments: There is skin breakdown in the perineal region including vaginal tissues perineum and the buttocks.  The patient is soiled in a thick amount of stool Musculoskeletal:        General: Tenderness present. No deformity.     Cervical back: Normal range of motion and neck supple.     Right lower leg: Edema present.     Left lower leg: Edema present.     Comments: Bruising over the right knee, left knee, bilateral lower extremity edema  Skin:    Comments:  Skin breakdown in the perineum as noted  Neurological:     Comments: Able to help manipulate and maneuver in the bed, able to move all 4 extremities     ED Results / Procedures / Treatments   Labs (all labs ordered are listed, but only abnormal results are displayed) Labs Reviewed - No data to display  EKG None  Radiology No results found.  Procedures .Critical Care  Performed by: Noemi Chapel, MD Authorized by: Noemi Chapel, MD   Critical care provider statement:    Critical care time (minutes):  45   Critical care time was exclusive of:  Separately billable procedures and treating other patients   Critical care was necessary to treat or prevent imminent or life-threatening deterioration of the following conditions:  Sepsis   Critical care was time spent personally by me on the following activities:  Development of treatment plan with patient or surrogate, discussions with consultants, evaluation of patient's response to treatment, examination of patient, obtaining history from patient or surrogate, review of old charts, re-evaluation of patient's condition, pulse oximetry, ordering and review of radiographic studies, ordering and review of laboratory studies and ordering and performing treatments and interventions   I assumed direction of critical care for this patient from another provider in my specialty: no     Care discussed with: admitting provider   Comments:           Medications Ordered in ED Medications - No data to display  ED Course/ Medical Decision Making/ A&P                             Medical Decision Making Amount and/or Complexity of Data Reviewed Labs: ordered. Radiology: ordered.  Risk Prescription drug management. Decision regarding hospitalization.   This patient presents to the ED for concern of progressive weakness altered mental status and incontinence, this involves an extensive number of treatment options, and is a complaint that  carries with it a high risk of complications and morbidity.  The differential diagnosis includes could be related to prior trauma, consider subdural, consider infection, consider renal failure or electrolyte abnormality, consider stroke   Co morbidities that complicate the patient evaluation  Elderly, confused and altered   Additional history obtained:  Additional history obtained from paramedics and the electronic medical record External records from outside source obtained and reviewed including see history regarding family doctor notes, no recent admissions   Lab Tests:  I Ordered, and personally interpreted labs.  The pertinent results include: Severe leukocytosis of 33,000, albumin of 2.5, sodium of 130 with a preserved renal function but an elevated BUN.  Slight elevation in platelets to 550,000, TSH is high at 7.5 consistent with hypothyroidism   Imaging Studies ordered:  I ordered imaging studies including CT scan of the abdomen and pelvis as well as a chest x-ray, x-rays of the hips and knees I independently visualized and interpreted imaging which showed no signs of traumatic injuries to the head or cervical spine, there is a CT scan of the abdomen and pelvis which does show that she has what appears to be diffuse colonic thickening and colitis I agree with the radiologist interpretation   Cardiac Monitoring: / EKG:  The patient was maintained on a cardiac monitor.  I personally viewed and interpreted the cardiac monitored which showed an underlying rhythm of: Normal sinus rhythm, borderline tachycardia   Consultations Obtained:  I requested consultation with the hospitalist,  and discussed lab and imaging findings as well as pertinent plan - they recommend: Admission to the hospital.  Dr. Denton Brick to admit.   Problem List / ED Course / Critical interventions / Medication management  Due to the very high white blood cell count I did order a C. difficile test, this has  not yet resulted.  I have also ordered antibiotics includes this is infectious colitis. I ordered medication including IV fluids and antibiotics for intra-abdominal inflammatory process Reevaluation of the patient after these medicines showed that the patient improving but critically ill with severe leukocytosis and source of possible infection in the belly I have reviewed the patients home medicines and have made adjustments as needed   Social Determinants of Health:  Elderly Confused and weak   Test / Admission - Considered:  Will admit to the hospital         Final Clinical Impression(s) / ED Diagnoses Final diagnoses:  Colitis  Hyponatremia  Weakness     Noemi Chapel, MD 04/06/22 1235

## 2022-04-06 NOTE — ED Notes (Signed)
Attempted report 

## 2022-04-06 NOTE — Progress Notes (Addendum)
Patient's bottom is raw to the point it is bleeding.Patient continues to have bowel movements. MD Emokpae notified to inquire a flexiseal.  [id number] flexiseal placed.

## 2022-04-06 NOTE — ED Notes (Signed)
Pt has been In & Out twice without any urinary output.

## 2022-04-06 NOTE — H&P (Signed)
Patient Demographics:    Adriana Peters, is a 89 y.o. female  MRN: [id number]   DOB - Dec 30, 1928  Admit Date - 04/06/2022  Outpatient Primary MD for the patient is [name], [name], MD   Assessment & Plan:   Assessment and Plan  1) C. difficile colitis--- excessive diarrhea persist -WBC 33, -Stool for C. difficile antigen and toxin are positive -CT abdomen and pelvis with pancolitis,  -Stop IV Rocephin and Flagyl started in the ED -Start oral vancomycin per protocol -IV fluids as ordered  2) hyponatremia/hypochloremia with dehydration and metabolic acidosis - due to #1 above -Hydrate IV and orally ---Sodium 130, bicarb 20 chloride 97  3) hypothyroidism----TSH 7.5,  -Restart Armour Thyroid  4)Hiatal Hernia GERD--- CT abdomen and pelvis shows large hiatal hernia containing majority of the stomach,  -Treat empirically with Pepcid and Tums  5)S/p Fall----right frontal/periorbital area hemostatic wound with ecchymosis noted -CT head and CT C-spine without acute findings -Chest x-Peters without acute findings -X-rays of the right knee, left knee, right hip and left hip without acute fractures -Get physical therapy eval  6)-dementia ---history is limited due to patient's cognitive and memory deficits -Supportive care advised -ok to reorient and redirect  7)Abnormal Adnexal Finding--- ET abdomen and pelvis shows right adnexal septated cystic focus measures 5.0 x 2.9 cm. Small volume fluid within the endometrial canal -Patient is 89 years old -Pelvic ultrasound if family desires  Status is: Inpatient  Remains inpatient appropriate because:   Dispo: The patient is from: Home              Anticipated d/c is to:  Home with HH Vs SNF              Anticipated d/c date is: 2 days              Patient currently is  not medically stable to d/c. Barriers: Not Clinically Stable-   With History of - Reviewed by me  Past Medical History:  Diagnosis Date   Arthritis    GERD (gastroesophageal reflux disease)    Osteoporosis    Thyroid disease    Hypothyroidism.      History reviewed. No pertinent surgical history.  Chief Complaint  Patient presents with   Altered Mental Status      HPI:    Adriana Peters  is a 89 y.o. female with past medical history relevant for hiatal hernia with GERD, osteoporosis, hypothyroidism who presents by EMS to the ED with diarrhea concerns -History is limited due to patient's cognitive and memory deficits -Unable to reach patient's husband who she lives with who is 76 years old -Per EMS crew apparently patient has had a fall at home a couple of weeks ago and she has been weak and unable to get permission -Patient tells me diarrhea has persisted for at least 3 days--- further history is limited -Apparently no fevers and no vomiting -No chest pains no  palpitations no dizziness no shortness of breath -In the ED CT abdomen and pelvis with pancolitis, large hiatal hernia containing majority of the stomach, Right adnexal septated cystic focus measures 5.0 x 2.9 cm. Small volume fluid within the endometrial canal -Stool for C. difficile antigen and toxin are positive -CT head and CT C-spine without acute findings -Chest x-Peters without acute findings -X-rays of the right knee, left knee, right hip and left hip without acute fractures -UA does not suggest UTI -BNP 184, TSH 7.5,  -Troponin is only 10 -WBC 33, platelets 551 hemoglobin 12.5 -Sodium 130, bicarb 20 chloride 97, creatinine 0.86  Review of systems:    In addition to the HPI above,   A full Review of  Systems was done, all other systems reviewed are negative except as noted above in HPI , .    Social History:  Reviewed by me    Social History   Tobacco Use   Smoking status: Never   Smokeless tobacco:  Never  Substance Use Topics   Alcohol use: Not Currently    Comment: Wine    Family History :  Reviewed by me    Family History  Problem Relation Age of Onset   Diabetes Neg Hx    Cancer Neg Hx     Home Medications:   Prior to Admission medications   Medication Sig Start Date End Date Taking? Authorizing Provider  thyroid (ARMOUR THYROID) 60 MG tablet TAKE 1 TABLET BY MOUTH EVERY DAY WITH BREAKFAST 08/03/21  Yes [name], [name], MD     Allergies:    No Known Allergies   Physical Exam:   Vitals  Blood pressure (!) 122/54, pulse 92, temperature 98.2 F (36.8 C), temperature source Oral, resp. rate (!) 26, height '4\' 11"'$  (1.499 m), weight 49.9 kg, SpO2 100 %.  Physical Examination: General appearance - alert,  in no distress, frail and emaciated appearing Mental status - alert, oriented to person, some cognitive and memory deficits noted Head-right frontal/periorbital area hemostatic wound with ecchymosis noted Eyes - sclera anicteric Neck - supple, no JVD elevation , Chest - clear  to auscultation bilaterally, symmetrical air movement,  Heart - S1 and S2 normal, regular  Abdomen - soft, nontender, nondistended, +BS Neurological - -generalized weakness without frank new focal deficits, neck supple without rigidity, cranial nerves II through XII intact, DTR's normal and symmetric Extremities - no pedal edema noted, intact peripheral pulses  Skin - warm, dry, groin and perineal area with erythematous rash with satellite lesions consistent with presumed cutaneous candidiasis   Data Review:    CBC Recent Labs  Lab 04/06/22 1010  WBC 33.0*  HGB 12.5  HCT 37.4  PLT 551*  MCV 88.4  MCH 29.6  MCHC 33.4  RDW 14.4  LYMPHSABS 0.9  MONOABS 1.6*  EOSABS 0.0  BASOSABS 0.1   ------------------------------------------------------------------------------------------------------------------  Chemistries  Recent Labs  Lab 04/06/22 1010  NA 130*  K 3.7  CL 97*  CO2 20*   GLUCOSE 122*  BUN 24*  CREATININE 0.86  CALCIUM 7.8*  MG 1.9  AST 46*  ALT 35  ALKPHOS 92  BILITOT 1.1   ------------------------------------------------------------------------------------------------------------------ estimated creatinine clearance is 29.1 mL/min (by C-G formula based on SCr of 0.86 mg/dL). ------------------------------------------------------------------------------------------------------------------ Recent Labs    04/06/22 1010  TSH 7.514*   Coagulation profile Recent Labs  Lab 04/06/22 1129  INR 1.1   ------------------------------------------------------------------------------------------------------------------    Component Value Date/Time   BNP 184.0 (H) 04/06/2022 1010   Urinalysis  Component Value Date/Time   COLORURINE YELLOW 04/06/2022 1255   APPEARANCEUR HAZY (A) 04/06/2022 1255   LABSPEC 1.029 04/06/2022 1255   PHURINE 5.0 04/06/2022 1255   GLUCOSEU NEGATIVE 04/06/2022 1255   HGBUR NEGATIVE 04/06/2022 1255   BILIRUBINUR NEGATIVE 04/06/2022 1255   KETONESUR 20 (A) 04/06/2022 1255   PROTEINUR NEGATIVE 04/06/2022 1255   NITRITE NEGATIVE 04/06/2022 1255   LEUKOCYTESUR NEGATIVE 04/06/2022 1255    Imaging Results:    CT ABDOMEN PELVIS W CONTRAST  Result Date: 04/06/2022 CLINICAL DATA:  History of fall 2 weeks ago with generalized abdominal pain and altered mental status EXAM: CT ABDOMEN AND PELVIS WITH CONTRAST TECHNIQUE: Multidetector CT imaging of the abdomen and pelvis was performed using the standard protocol following bolus administration of intravenous contrast. RADIATION DOSE REDUCTION: This exam was performed according to the departmental dose-optimization program which includes automated exposure control, adjustment of the mA and/or kV according to patient size and/or use of iterative reconstruction technique. CONTRAST:  71m OMNIPAQUE IOHEXOL 300 MG/ML  SOLN COMPARISON:  None Available. FINDINGS: Lower chest: No focal  consolidation or pulmonary nodule in the lung bases. Moderate left pleural effusion. Partially imaged heart size is normal. Hepatobiliary: The superior most aspect of the liver is not included within the field of view on portal venous phase but is included within the renal delay. No focal hepatic lesions. No intra or extrahepatic biliary ductal dilation. Normal gallbladder. Pancreas: No focal lesions or main ductal dilation. Spleen: Normal in size without focal abnormality. Adrenals/Urinary Tract: No adrenal nodules. No renal calculi or hydronephrosis. A prominent focus of subcentimeter enhancement within the lower pole left kidney (5:48) is indeterminate. Right subcentimeter hypodensities, too small to characterize but likely cysts. No focal bladder wall thickening. Stomach/Bowel: Large hiatal hernia containing the majority of the stomach. Diffuse mural thickening of the colon to the level of the rectum. Mild mural thickening of the terminal ileum, likely reactive. Colonic diverticulosis without acute diverticulitis. Normal appendix. Vascular/Lymphatic: Aortic atherosclerosis. No enlarged abdominal or pelvic lymph nodes. Reproductive: Small volume fluid within the endometrial canal. Right adnexal septated cystic focus measures 5.0 by 2.9 cm (2:51). No left adnexal mass. Other: No free fluid, fluid collection, or free air. Musculoskeletal: No acute or abnormal lytic or blastic osseous lesions. Multilevel degenerative changes of the partially imaged thoracic and lumbar spine. Dextroscoliosis of the lumbar spine. Degenerative changes of the bilateral hips. Age indeterminate fractures of the right L2-L4 transverse processes. Old, healed fractures of the bilateral superior and inferior pubic rami. Diffuse body wall edema. IMPRESSION: 1. Diffuse mural thickening of the colon to the level of the rectum suggestive of pancolitis. 2. Large hiatal hernia containing the majority of the stomach. 3. Right adnexal septated cystic  focus measures 5.0 x 2.9 cm. Small volume fluid within the endometrial canal. Recommend nonemergent pelvic ultrasound for further evaluation. 4. Age indeterminate fractures of the right L2-L4 transverse processes. Old, healed fractures of the bilateral superior and inferior pubic rami. 5. Indeterminate subcentimeter enhancing focus within the lower pole left kidney may reflect volume averaging artifact. Attention on follow-up. 6. Moderate left pleural effusion. 7.  Aortic Atherosclerosis (ICD10-I70.0). Electronically Signed   By: LDarrin NipperM.D.   On: 04/06/2022 12:24   DG Chest Port 1 View  Result Date: 04/06/2022 CLINICAL DATA:  Shortness of breath EXAM: PORTABLE CHEST 1 VIEW COMPARISON:  02/12/2016 FINDINGS: The heart size and mediastinal contours are within normal limits. Hiatal hernia. Aortic atherosclerosis. No focal airspace consolidation, pleural effusion, or pneumothorax.  The visualized skeletal structures are unremarkable. IMPRESSION: No active disease. Electronically Signed   By: Davina Poke D.O.   On: 04/06/2022 10:50   DG HIP UNILAT WITH PELVIS 2-3 VIEWS LEFT  Result Date: 04/06/2022 CLINICAL DATA:  Pain.  Recent fall EXAM: DG HIP (WITH OR WITHOUT PELVIS) 2-3V LEFT; DG HIP (WITH OR WITHOUT PELVIS) 2-3V RIGHT COMPARISON:  None Available. FINDINGS: Diffuse bony demineralization. Chronic healed fracture deformities of the left superior and inferior pubic rami and the parasymphyseal aspect of the right pubic bone with bulky callus formation. No evidence of acute pelvic fracture or diastasis. Bilateral hip joints are intact without fracture or dislocation. IMPRESSION: 1. No acute fracture or dislocation of the bilateral hips. 2. Chronic healed fracture deformities of the left superior and inferior pubic rami and the parasymphyseal aspect of the right pubic bone. Electronically Signed   By: Davina Poke D.O.   On: 04/06/2022 10:49   DG HIP UNILAT WITH PELVIS 2-3 VIEWS RIGHT  Result Date:  04/06/2022 CLINICAL DATA:  Pain.  Recent fall EXAM: DG HIP (WITH OR WITHOUT PELVIS) 2-3V LEFT; DG HIP (WITH OR WITHOUT PELVIS) 2-3V RIGHT COMPARISON:  None Available. FINDINGS: Diffuse bony demineralization. Chronic healed fracture deformities of the left superior and inferior pubic rami and the parasymphyseal aspect of the right pubic bone with bulky callus formation. No evidence of acute pelvic fracture or diastasis. Bilateral hip joints are intact without fracture or dislocation. IMPRESSION: 1. No acute fracture or dislocation of the bilateral hips. 2. Chronic healed fracture deformities of the left superior and inferior pubic rami and the parasymphyseal aspect of the right pubic bone. Electronically Signed   By: Davina Poke D.O.   On: 04/06/2022 10:49   DG Knee Complete 4 Views Left  Result Date: 04/06/2022 CLINICAL DATA:  Recent fall EXAM: LEFT KNEE - COMPLETE 4+ VIEW COMPARISON:  None Available. FINDINGS: No evidence of fracture, dislocation, or joint effusion. Mild tricompartmental osteoarthritis of the left knee. Soft tissues are unremarkable. IMPRESSION: Mild tricompartmental osteoarthritis of the left knee. No acute findings. Electronically Signed   By: Davina Poke D.O.   On: 04/06/2022 10:47   DG Knee Complete 4 Views Right  Result Date: 04/06/2022 CLINICAL DATA:  Recent fall EXAM: RIGHT KNEE - COMPLETE 4+ VIEW COMPARISON:  None Available. FINDINGS: No acute fracture or dislocation. No joint effusion. Moderate to severe tricompartmental osteoarthritis is most pronounced in the lateral compartment. Bones are demineralized. No focal soft tissue swelling. IMPRESSION: 1. No acute fracture or dislocation of the right knee. 2. Moderate to severe tricompartmental osteoarthritis. Electronically Signed   By: Davina Poke D.O.   On: 04/06/2022 10:46   CT Head Wo Contrast  Result Date: 04/06/2022 CLINICAL DATA:  89 year old female with altered mental status and possible head and neck injury.  EXAM: CT HEAD WITHOUT CONTRAST CT CERVICAL SPINE WITHOUT CONTRAST TECHNIQUE: Multidetector CT imaging of the head and cervical spine was performed following the standard protocol without intravenous contrast. Multiplanar CT image reconstructions of the cervical spine were also generated. RADIATION DOSE REDUCTION: This exam was performed according to the departmental dose-optimization program which includes automated exposure control, adjustment of the mA and/or kV according to patient size and/or use of iterative reconstruction technique. COMPARISON:  08/14/2021 head CT FINDINGS: CT HEAD FINDINGS Brain: No evidence of acute infarction, hemorrhage, hydrocephalus, extra-axial collection or mass lesion/mass effect. Atrophy and chronic small-vessel white matter ischemic changes again noted. Vascular: Carotid atherosclerotic calcifications are noted. Skull: Normal. Negative for fracture  or focal lesion. Sinuses/Orbits: No acute finding. Other: None. CT CERVICAL SPINE FINDINGS Alignment: 4 mm anterolisthesis of C3 on C4 is unchanged when compared to 08/14/2021 head CT scout film. Straightening of the normal cervical lordosis is noted. No acute subluxation identified. Skull base and vertebrae: No acute fracture. No primary bone lesion or focal pathologic process. Soft tissues and spinal canal: No prevertebral fluid or swelling. No visible canal hematoma. Disc levels: Multilevel degenerative disc disease, spondylosis and facet arthropathy noted. Degenerative changes are greatest from C3-C7. These findings contribute to mild central spinal and bony foraminal narrowing at multiple levels. Upper chest: No acute abnormality. Other: None IMPRESSION: 1. No evidence of acute intracranial abnormality. Atrophy and chronic small-vessel white matter ischemic changes. 2. No static evidence of acute injury to the cervical spine. Multilevel degenerative changes as described. Electronically Signed   By: Margarette Canada M.D.   On: 04/06/2022  10:42   CT Cervical Spine Wo Contrast  Result Date: 04/06/2022 CLINICAL DATA:  89 year old female with altered mental status and possible head and neck injury. EXAM: CT HEAD WITHOUT CONTRAST CT CERVICAL SPINE WITHOUT CONTRAST TECHNIQUE: Multidetector CT imaging of the head and cervical spine was performed following the standard protocol without intravenous contrast. Multiplanar CT image reconstructions of the cervical spine were also generated. RADIATION DOSE REDUCTION: This exam was performed according to the departmental dose-optimization program which includes automated exposure control, adjustment of the mA and/or kV according to patient size and/or use of iterative reconstruction technique. COMPARISON:  08/14/2021 head CT FINDINGS: CT HEAD FINDINGS Brain: No evidence of acute infarction, hemorrhage, hydrocephalus, extra-axial collection or mass lesion/mass effect. Atrophy and chronic small-vessel white matter ischemic changes again noted. Vascular: Carotid atherosclerotic calcifications are noted. Skull: Normal. Negative for fracture or focal lesion. Sinuses/Orbits: No acute finding. Other: None. CT CERVICAL SPINE FINDINGS Alignment: 4 mm anterolisthesis of C3 on C4 is unchanged when compared to 08/14/2021 head CT scout film. Straightening of the normal cervical lordosis is noted. No acute subluxation identified. Skull base and vertebrae: No acute fracture. No primary bone lesion or focal pathologic process. Soft tissues and spinal canal: No prevertebral fluid or swelling. No visible canal hematoma. Disc levels: Multilevel degenerative disc disease, spondylosis and facet arthropathy noted. Degenerative changes are greatest from C3-C7. These findings contribute to mild central spinal and bony foraminal narrowing at multiple levels. Upper chest: No acute abnormality. Other: None IMPRESSION: 1. No evidence of acute intracranial abnormality. Atrophy and chronic small-vessel white matter ischemic changes. 2. No  static evidence of acute injury to the cervical spine. Multilevel degenerative changes as described. Electronically Signed   By: Margarette Canada M.D.   On: 04/06/2022 10:42    Radiological Exams on Admission: CT ABDOMEN PELVIS W CONTRAST  Result Date: 04/06/2022 CLINICAL DATA:  History of fall 2 weeks ago with generalized abdominal pain and altered mental status EXAM: CT ABDOMEN AND PELVIS WITH CONTRAST TECHNIQUE: Multidetector CT imaging of the abdomen and pelvis was performed using the standard protocol following bolus administration of intravenous contrast. RADIATION DOSE REDUCTION: This exam was performed according to the departmental dose-optimization program which includes automated exposure control, adjustment of the mA and/or kV according to patient size and/or use of iterative reconstruction technique. CONTRAST:  45m OMNIPAQUE IOHEXOL 300 MG/ML  SOLN COMPARISON:  None Available. FINDINGS: Lower chest: No focal consolidation or pulmonary nodule in the lung bases. Moderate left pleural effusion. Partially imaged heart size is normal. Hepatobiliary: The superior most aspect of the liver is not included  within the field of view on portal venous phase but is included within the renal delay. No focal hepatic lesions. No intra or extrahepatic biliary ductal dilation. Normal gallbladder. Pancreas: No focal lesions or main ductal dilation. Spleen: Normal in size without focal abnormality. Adrenals/Urinary Tract: No adrenal nodules. No renal calculi or hydronephrosis. A prominent focus of subcentimeter enhancement within the lower pole left kidney (5:48) is indeterminate. Right subcentimeter hypodensities, too small to characterize but likely cysts. No focal bladder wall thickening. Stomach/Bowel: Large hiatal hernia containing the majority of the stomach. Diffuse mural thickening of the colon to the level of the rectum. Mild mural thickening of the terminal ileum, likely reactive. Colonic diverticulosis without  acute diverticulitis. Normal appendix. Vascular/Lymphatic: Aortic atherosclerosis. No enlarged abdominal or pelvic lymph nodes. Reproductive: Small volume fluid within the endometrial canal. Right adnexal septated cystic focus measures 5.0 by 2.9 cm (2:51). No left adnexal mass. Other: No free fluid, fluid collection, or free air. Musculoskeletal: No acute or abnormal lytic or blastic osseous lesions. Multilevel degenerative changes of the partially imaged thoracic and lumbar spine. Dextroscoliosis of the lumbar spine. Degenerative changes of the bilateral hips. Age indeterminate fractures of the right L2-L4 transverse processes. Old, healed fractures of the bilateral superior and inferior pubic rami. Diffuse body wall edema. IMPRESSION: 1. Diffuse mural thickening of the colon to the level of the rectum suggestive of pancolitis. 2. Large hiatal hernia containing the majority of the stomach. 3. Right adnexal septated cystic focus measures 5.0 x 2.9 cm. Small volume fluid within the endometrial canal. Recommend nonemergent pelvic ultrasound for further evaluation. 4. Age indeterminate fractures of the right L2-L4 transverse processes. Old, healed fractures of the bilateral superior and inferior pubic rami. 5. Indeterminate subcentimeter enhancing focus within the lower pole left kidney may reflect volume averaging artifact. Attention on follow-up. 6. Moderate left pleural effusion. 7.  Aortic Atherosclerosis (ICD10-I70.0). Electronically Signed   By: Darrin Nipper M.D.   On: 04/06/2022 12:24   DG Chest Port 1 View  Result Date: 04/06/2022 CLINICAL DATA:  Shortness of breath EXAM: PORTABLE CHEST 1 VIEW COMPARISON:  02/12/2016 FINDINGS: The heart size and mediastinal contours are within normal limits. Hiatal hernia. Aortic atherosclerosis. No focal airspace consolidation, pleural effusion, or pneumothorax. The visualized skeletal structures are unremarkable. IMPRESSION: No active disease. Electronically Signed   By:  Davina Poke D.O.   On: 04/06/2022 10:50   DG HIP UNILAT WITH PELVIS 2-3 VIEWS LEFT  Result Date: 04/06/2022 CLINICAL DATA:  Pain.  Recent fall EXAM: DG HIP (WITH OR WITHOUT PELVIS) 2-3V LEFT; DG HIP (WITH OR WITHOUT PELVIS) 2-3V RIGHT COMPARISON:  None Available. FINDINGS: Diffuse bony demineralization. Chronic healed fracture deformities of the left superior and inferior pubic rami and the parasymphyseal aspect of the right pubic bone with bulky callus formation. No evidence of acute pelvic fracture or diastasis. Bilateral hip joints are intact without fracture or dislocation. IMPRESSION: 1. No acute fracture or dislocation of the bilateral hips. 2. Chronic healed fracture deformities of the left superior and inferior pubic rami and the parasymphyseal aspect of the right pubic bone. Electronically Signed   By: Davina Poke D.O.   On: 04/06/2022 10:49   DG HIP UNILAT WITH PELVIS 2-3 VIEWS RIGHT  Result Date: 04/06/2022 CLINICAL DATA:  Pain.  Recent fall EXAM: DG HIP (WITH OR WITHOUT PELVIS) 2-3V LEFT; DG HIP (WITH OR WITHOUT PELVIS) 2-3V RIGHT COMPARISON:  None Available. FINDINGS: Diffuse bony demineralization. Chronic healed fracture deformities of the left superior and  inferior pubic rami and the parasymphyseal aspect of the right pubic bone with bulky callus formation. No evidence of acute pelvic fracture or diastasis. Bilateral hip joints are intact without fracture or dislocation. IMPRESSION: 1. No acute fracture or dislocation of the bilateral hips. 2. Chronic healed fracture deformities of the left superior and inferior pubic rami and the parasymphyseal aspect of the right pubic bone. Electronically Signed   By: Davina Poke D.O.   On: 04/06/2022 10:49   DG Knee Complete 4 Views Left  Result Date: 04/06/2022 CLINICAL DATA:  Recent fall EXAM: LEFT KNEE - COMPLETE 4+ VIEW COMPARISON:  None Available. FINDINGS: No evidence of fracture, dislocation, or joint effusion. Mild tricompartmental  osteoarthritis of the left knee. Soft tissues are unremarkable. IMPRESSION: Mild tricompartmental osteoarthritis of the left knee. No acute findings. Electronically Signed   By: Davina Poke D.O.   On: 04/06/2022 10:47   DG Knee Complete 4 Views Right  Result Date: 04/06/2022 CLINICAL DATA:  Recent fall EXAM: RIGHT KNEE - COMPLETE 4+ VIEW COMPARISON:  None Available. FINDINGS: No acute fracture or dislocation. No joint effusion. Moderate to severe tricompartmental osteoarthritis is most pronounced in the lateral compartment. Bones are demineralized. No focal soft tissue swelling. IMPRESSION: 1. No acute fracture or dislocation of the right knee. 2. Moderate to severe tricompartmental osteoarthritis. Electronically Signed   By: Davina Poke D.O.   On: 04/06/2022 10:46   CT Head Wo Contrast  Result Date: 04/06/2022 CLINICAL DATA:  89 year old female with altered mental status and possible head and neck injury. EXAM: CT HEAD WITHOUT CONTRAST CT CERVICAL SPINE WITHOUT CONTRAST TECHNIQUE: Multidetector CT imaging of the head and cervical spine was performed following the standard protocol without intravenous contrast. Multiplanar CT image reconstructions of the cervical spine were also generated. RADIATION DOSE REDUCTION: This exam was performed according to the departmental dose-optimization program which includes automated exposure control, adjustment of the mA and/or kV according to patient size and/or use of iterative reconstruction technique. COMPARISON:  08/14/2021 head CT FINDINGS: CT HEAD FINDINGS Brain: No evidence of acute infarction, hemorrhage, hydrocephalus, extra-axial collection or mass lesion/mass effect. Atrophy and chronic small-vessel white matter ischemic changes again noted. Vascular: Carotid atherosclerotic calcifications are noted. Skull: Normal. Negative for fracture or focal lesion. Sinuses/Orbits: No acute finding. Other: None. CT CERVICAL SPINE FINDINGS Alignment: 4 mm  anterolisthesis of C3 on C4 is unchanged when compared to 08/14/2021 head CT scout film. Straightening of the normal cervical lordosis is noted. No acute subluxation identified. Skull base and vertebrae: No acute fracture. No primary bone lesion or focal pathologic process. Soft tissues and spinal canal: No prevertebral fluid or swelling. No visible canal hematoma. Disc levels: Multilevel degenerative disc disease, spondylosis and facet arthropathy noted. Degenerative changes are greatest from C3-C7. These findings contribute to mild central spinal and bony foraminal narrowing at multiple levels. Upper chest: No acute abnormality. Other: None IMPRESSION: 1. No evidence of acute intracranial abnormality. Atrophy and chronic small-vessel white matter ischemic changes. 2. No static evidence of acute injury to the cervical spine. Multilevel degenerative changes as described. Electronically Signed   By: Margarette Canada M.D.   On: 04/06/2022 10:42   CT Cervical Spine Wo Contrast  Result Date: 04/06/2022 CLINICAL DATA:  89 year old female with altered mental status and possible head and neck injury. EXAM: CT HEAD WITHOUT CONTRAST CT CERVICAL SPINE WITHOUT CONTRAST TECHNIQUE: Multidetector CT imaging of the head and cervical spine was performed following the standard protocol without intravenous contrast. Multiplanar CT image  reconstructions of the cervical spine were also generated. RADIATION DOSE REDUCTION: This exam was performed according to the departmental dose-optimization program which includes automated exposure control, adjustment of the mA and/or kV according to patient size and/or use of iterative reconstruction technique. COMPARISON:  08/14/2021 head CT FINDINGS: CT HEAD FINDINGS Brain: No evidence of acute infarction, hemorrhage, hydrocephalus, extra-axial collection or mass lesion/mass effect. Atrophy and chronic small-vessel white matter ischemic changes again noted. Vascular: Carotid atherosclerotic  calcifications are noted. Skull: Normal. Negative for fracture or focal lesion. Sinuses/Orbits: No acute finding. Other: None. CT CERVICAL SPINE FINDINGS Alignment: 4 mm anterolisthesis of C3 on C4 is unchanged when compared to 08/14/2021 head CT scout film. Straightening of the normal cervical lordosis is noted. No acute subluxation identified. Skull base and vertebrae: No acute fracture. No primary bone lesion or focal pathologic process. Soft tissues and spinal canal: No prevertebral fluid or swelling. No visible canal hematoma. Disc levels: Multilevel degenerative disc disease, spondylosis and facet arthropathy noted. Degenerative changes are greatest from C3-C7. These findings contribute to mild central spinal and bony foraminal narrowing at multiple levels. Upper chest: No acute abnormality. Other: None IMPRESSION: 1. No evidence of acute intracranial abnormality. Atrophy and chronic small-vessel white matter ischemic changes. 2. No static evidence of acute injury to the cervical spine. Multilevel degenerative changes as described. Electronically Signed   By: Margarette Canada M.D.   On: 04/06/2022 10:42    DVT Prophylaxis -SCD /Heparin AM Labs Ordered, also please review Full Orders  Family Communication: Admission, patients condition and plan of care including tests being ordered have been discussed with the patient  who indicate understanding and agree with the plan   Condition  -stable  Roxan Hockey M.D on 04/06/2022 at 3:22 PM Go to [email or web address] -  for contact info  [facility] - Office  [phone]

## 2022-04-06 NOTE — Progress Notes (Signed)
Patients husband arrived to unit. Told patient's husband she had been having diarrhea.  Stated he did not know patient was having diarrhea. He stated he would be back in the morning.

## 2022-04-06 NOTE — Progress Notes (Signed)
Patient C.Diff positive result reported to Dr. Joesph Fillers.

## 2022-04-06 NOTE — Progress Notes (Addendum)
Attempted calling ED for report. No answer.

## 2022-04-07 DIAGNOSIS — A0472 Enterocolitis due to Clostridium difficile, not specified as recurrent: Secondary | ICD-10-CM | POA: Diagnosis not present

## 2022-04-07 LAB — CBC
HCT: 31.3 % — ABNORMAL LOW (ref 36.0–46.0)
Hemoglobin: 10.4 g/dL — ABNORMAL LOW (ref 12.0–15.0)
MCH: 30 pg (ref 26.0–34.0)
MCHC: 33.2 g/dL (ref 30.0–36.0)
MCV: 90.2 fL (ref 80.0–100.0)
Platelets: 469 10*3/uL — ABNORMAL HIGH (ref 150–400)
RBC: 3.47 MIL/uL — ABNORMAL LOW (ref 3.87–5.11)
RDW: 14.5 % (ref 11.5–15.5)
WBC: 22.5 10*3/uL — ABNORMAL HIGH (ref 4.0–10.5)
nRBC: 0 % (ref 0.0–0.2)

## 2022-04-07 LAB — GASTROINTESTINAL PANEL BY PCR, STOOL (REPLACES STOOL CULTURE)

## 2022-04-07 LAB — BASIC METABOLIC PANEL
Anion gap: 11 (ref 5–15)
BUN: 17 mg/dL (ref 8–23)
CO2: 18 mmol/L — ABNORMAL LOW (ref 22–32)
Calcium: 7.3 mg/dL — ABNORMAL LOW (ref 8.9–10.3)
Chloride: 105 mmol/L (ref 98–111)
Creatinine, Ser: 0.61 mg/dL (ref 0.44–1.00)
GFR, Estimated: >60 mL/min (ref >60)
Glucose, Bld: 83 mg/dL (ref 70–99)
Potassium: 3.4 mmol/L — ABNORMAL LOW (ref 3.5–5.1)
Sodium: 134 mmol/L — ABNORMAL LOW (ref 135–145)

## 2022-04-07 MED ORDER — POTASSIUM CHLORIDE IN NACL 20-0.9 MEQ/L-% IV SOLN: INTRAVENOUS | Status: DC

## 2022-04-07 MED ORDER — POTASSIUM CHLORIDE CRYS ER 20 MEQ PO TBCR
40.0000 meq | EXTENDED_RELEASE_TABLET | ORAL | Status: AC
  Administered 2022-04-07 (×2): 40 meq via ORAL
  Filled 2022-04-07 (×2): qty 2

## 2022-04-07 NOTE — TOC Progression Note (Signed)
Transition of Care [facility]) - Progression Note    Patient Details  Name: Adriana Peters MRN: [id number] Date of Birth: 1928/02/02  Transition of Care [facility]) CM/SW [place], [place] Phone Number: 04/07/2022, 2:16 PM  Clinical Narrative:      Transition of Care [facility]) Screening Note   Patient Details  Name: Adriana Peters Date of Birth: 07-Dec-1928   Transition of Care (TOC) CM/SW Contact:    Leo Rod, LCSW Phone Number: 04/07/2022, 2:17 PM  PT pending will evaluate for DC needs.  Transition of Care Department [facility]) has reviewed patient and no TOC needs have been identified at this time. We will continue to monitor patient advancement through interdisciplinary progression rounds. If new patient transition needs arise, please place a TOC consult.      Barriers to Discharge: Continued Medical Work up  Expected Discharge Plan and Services                                               Social Determinants of Health (SDOH) Interventions SDOH Screenings   Food Insecurity: No Food Insecurity (04/07/2022)  Housing: Low Risk  (04/07/2022)  Transportation Needs: No Transportation Needs (04/07/2022)  Utilities: Not At Risk (04/07/2022)  Depression (PHQ2-9): Low Risk  (10/27/2019)  Financial Resource Strain: Low Risk  (10/27/2019)  Physical Activity: Sufficiently Active (10/27/2019)  Social Connections: Moderately Isolated (10/27/2019)  Stress: No Stress Concern Present (10/27/2019)  Tobacco Use: Low Risk  (04/06/2022)    Readmission Risk Interventions     No data to display

## 2022-04-07 NOTE — Progress Notes (Signed)
Patient had slept through the night during this shift. Patient has had no complaints of pain or discomfort. Patient has had episode of confusion but in easily reoriented to time and place. Plan of care ongoing.

## 2022-04-07 NOTE — Progress Notes (Signed)
PROGRESS NOTE     Adriana Peters, is a 89 y.o. female, DOB - [birth year], [id number]  Admit date - 04/06/2022   Admitting Physician Birdie Beveridge Denton Brick, MD  Outpatient Primary MD for the patient is Adriana, [name], MD  LOS - 1  Chief Complaint  Patient presents with   Altered Mental Status        Brief Narrative:  89 y.o. female with past medical history relevant for hiatal hernia with GERD, osteoporosis, hypothyroidism admitted on 04/06/2022 with c diff colitis and electrolyte derangement    -Assessment and Plan: 1) C. difficile colitis--- excessive diarrhea persist -WBC 33 >>22.5,  -Stool for C. difficile antigen and toxin are positive -CT abdomen and pelvis with pancolitis,  -Stopped IV Rocephin  04/07/22 --Volume and frequency of stool remain high -c/n oral vancomycin per protocol -c/n IV fluids as ordered    2) hyponatremia/hypochloremia/hypokalemia with dehydration and metabolic acidosis - due to #1 above -Hydrate and replace electrolytes   3) hypothyroidism----TSH 7.5,  -Restarted Armour Thyroid   4)Hiatal Hernia GERD--- CT abdomen and pelvis shows large hiatal hernia containing majority of the stomach,  -c/n  Pepcid and Tums   5)S/p Fall----right frontal/periorbital area hemostatic wound with ecchymosis noted -CT head and CT C-spine without acute findings -Chest x-ray without acute findings -X-rays of the right knee, left knee, right hip and left hip without acute fractures -Requested physical therapy eval   6)Dementia ---history is limited due to patient's cognitive and memory deficits -Supportive care advised -ok to reorient and redirect   7)Abnormal Adnexal Finding--- ET abdomen and pelvis shows right adnexal septated cystic focus measures 5.0 x 2.9 cm. Small volume fluid within the endometrial canal -Patient is 89 years old -Pelvic ultrasound if family desires  Status is: Inpatient   Disposition: The patient is from: Home              Anticipated d/c  is to: SNF              Anticipated d/c date is: 2 days              Patient currently is not medically stable to d/c. Barriers: Not Clinically Stable-   Code Status :  -  Code Status: Full Code   Family Communication:    Husband and is primary contact  DVT Prophylaxis  :   - SCDs   heparin injection 5,000 Units Start: 04/06/22 1800 SCDs Start: 04/06/22 1405 Place TED hose Start: 04/06/22 1405   Lab Results  Component Value Date   PLT 469 (H) 04/07/2022    Inpatient Medications  Scheduled Meds:  calcium carbonate  400 mg of elemental calcium Oral BID   famotidine  10 mg Oral QHS   heparin  5,000 Units Subcutaneous Q8H   nystatin-triamcinolone   Topical BID   sodium chloride flush  3 mL Intravenous Q12H   sodium chloride flush  3 mL Intravenous Q12H   thyroid  60 mg Oral QAC breakfast   vancomycin  125 mg Oral QID   Continuous Infusions:  sodium chloride     sodium chloride 100 mL/hr at 04/07/22 1118   PRN Meds:.sodium chloride, acetaminophen **OR** acetaminophen, LORazepam, ondansetron **OR** ondansetron (ZOFRAN) IV, sodium chloride flush, traMADol, traZODone   Anti-infectives (From admission, onward)    Start     Dose/Rate Route Frequency Ordered Stop   04/06/22 1500  vancomycin (VANCOCIN) capsule 125 mg        125 mg Oral 4 times daily 04/06/22  1408 04/16/22 1359   04/06/22 1300  cefTRIAXone (ROCEPHIN) 2 g in sodium chloride 0.9 % 100 mL IVPB  Status:  Discontinued        2 g 200 mL/hr over 30 Minutes Intravenous Every 24 hours 04/06/22 1235 04/06/22 1409   04/06/22 1300  metroNIDAZOLE (FLAGYL) IVPB 500 mg        500 mg 100 mL/hr over 60 Minutes Intravenous Every 12 hours 04/06/22 1235 04/07/22 0823   04/06/22 1230  piperacillin-tazobactam (ZOSYN) IVPB 3.375 g  Status:  Discontinued        3.375 g 100 mL/hr over 30 Minutes Intravenous  Once 04/06/22 1220 04/06/22 1234         Subjective: Estill Bakes today has no fevers, no emesis,  No chest pain,    - Continue to have high-volume loose stools with mucus   Objective: Vitals:   04/06/22 1358 04/06/22 1800 04/06/22 2042 04/07/22 0453  BP: (!) 122/54 (!) 131/54 137/60 125/60  Pulse: 65 83 89 85  Resp: (!) 26 (!) '24 16 16  '$ Temp: 98.2 F (36.8 C) 98.2 F (36.8 C) 97.8 F (36.6 C) 97.7 F (36.5 C)  TempSrc: Oral Oral    SpO2: 100% 96% 100% 97%  Weight:      Height:        Intake/Output Summary (Last 24 hours) at 04/07/2022 1343 Last data filed at 04/07/2022 1118 Gross per 24 hour  Intake 3775.84 ml  Output --  Net 3775.84 ml   Filed Weights   04/06/22 0945  Weight: 49.9 kg    Physical Exam Physical Examination: General appearance - alert,  in no distress, frail and emaciated appearing Mental status - alert, oriented to person, some cognitive and memory deficits noted Head-right frontal/periorbital area hemostatic wound with ecchymosis noted Eyes - sclera anicteric Neck - supple, no JVD elevation , Chest - clear  to auscultation bilaterally, symmetrical air movement,  Heart - S1 and S2 normal, regular  Abdomen - soft,  nondistended, +BS, mild discomfort with palpation Neurological - -generalized weakness without frank new focal deficits, neck supple without rigidity, cranial nerves II through XII intact, DTR's normal and symmetric Extremities -  intact peripheral pulses , right knee ecchymosis Skin - warm, dry, groin and perineal area with erythematous rash with satellite lesions consistent with presumed cutaneous candidiasis  Data Reviewed: I have personally reviewed following labs and imaging studies  CBC: Recent Labs  Lab 04/06/22 1010 04/07/22 0449  WBC 33.0* 22.5*  NEUTROABS 30.0*  --   HGB 12.5 10.4*  HCT 37.4 31.3*  MCV 88.4 90.2  PLT 551* [id number]*   Basic Metabolic Panel: Recent Labs  Lab 04/06/22 1010 04/07/22 0449  NA 130* 134*  K 3.7 3.4*  CL 97* 105  CO2 20* 18*  GLUCOSE 122* 83  BUN 24* 17  CREATININE 0.86 0.61  CALCIUM 7.8* 7.3*  MG 1.9   --    GFR: Estimated Creatinine Clearance: 31.2 mL/min (by C-G formula based on SCr of 0.61 mg/dL). Liver Function Tests: Recent Labs  Lab 04/06/22 1010  AST 46*  ALT 35  ALKPHOS 92  BILITOT 1.1  PROT 5.6*  ALBUMIN 2.5*   Recent Results (from the past 240 hour(s))  C Difficile Quick Screen w PCR reflex     Status: Abnormal   Collection Time: 04/06/22 12:55 PM   Specimen: Stool  Result Value Ref Range Status   C Diff antigen POSITIVE (A) NEGATIVE Final   C Diff toxin POSITIVE (A)  NEGATIVE Final   C Diff interpretation Toxin producing C. difficile detected.  Final    Comment: CRITICAL RESULT CALLED TO, READ BACK BY AND VERIFIED WITH: BERNARD @ [id number] ON [id number] BY HENDERSON L Performed at [facility], [address], [place], [place] [address]   Culture, blood (Routine X 2) w Reflex to ID Panel     Status: None (Preliminary result)   Collection Time: 04/06/22  1:20 PM   Specimen: BLOOD  Result Value Ref Range Status   Specimen Description BLOOD BLOOD RIGHT HAND  Final   Special Requests NONE  Final   Culture   Final    NO GROWTH < 12 HOURS Performed at [facility], [address], [place], [place] [address]    Report Status PENDING  Incomplete  Culture, blood (Routine X 2) w Reflex to ID Panel     Status: None (Preliminary result)   Collection Time: 04/06/22  1:20 PM   Specimen: Left Antecubital; Blood  Result Value Ref Range Status   Specimen Description LEFT ANTECUBITAL BOTTLES DRAWN AEROBIC ONLY  Final   Special Requests Blood Culture adequate volume  Final   Culture   Final    NO GROWTH < 12 HOURS Performed at [facility], [address] [place], [place] [address]    Report Status PENDING  Incomplete      Radiology Studies: CT ABDOMEN PELVIS W CONTRAST  Result Date: 04/06/2022 CLINICAL DATA:  History of fall 2 weeks ago with generalized abdominal pain and altered mental status EXAM: CT ABDOMEN AND PELVIS WITH CONTRAST TECHNIQUE: Multidetector CT imaging of the  abdomen and pelvis was performed using the standard protocol following bolus administration of intravenous contrast. RADIATION DOSE REDUCTION: This exam was performed according to the departmental dose-optimization program which includes automated exposure control, adjustment of the mA and/or kV according to patient size and/or use of iterative reconstruction technique. CONTRAST:  38m OMNIPAQUE IOHEXOL 300 MG/ML  SOLN COMPARISON:  None Available. FINDINGS: Lower chest: No focal consolidation or pulmonary nodule in the lung bases. Moderate left pleural effusion. Partially imaged heart size is normal. Hepatobiliary: The superior most aspect of the liver is not included within the field of view on portal venous phase but is included within the renal delay. No focal hepatic lesions. No intra or extrahepatic biliary ductal dilation. Normal gallbladder. Pancreas: No focal lesions or main ductal dilation. Spleen: Normal in size without focal abnormality. Adrenals/Urinary Tract: No adrenal nodules. No renal calculi or hydronephrosis. A prominent focus of subcentimeter enhancement within the lower pole left kidney (5:48) is indeterminate. Right subcentimeter hypodensities, too small to characterize but likely cysts. No focal bladder wall thickening. Stomach/Bowel: Large hiatal hernia containing the majority of the stomach. Diffuse mural thickening of the colon to the level of the rectum. Mild mural thickening of the terminal ileum, likely reactive. Colonic diverticulosis without acute diverticulitis. Normal appendix. Vascular/Lymphatic: Aortic atherosclerosis. No enlarged abdominal or pelvic lymph nodes. Reproductive: Small volume fluid within the endometrial canal. Right adnexal septated cystic focus measures 5.0 by 2.9 cm (2:51). No left adnexal mass. Other: No free fluid, fluid collection, or free air. Musculoskeletal: No acute or abnormal lytic or blastic osseous lesions. Multilevel degenerative changes of the partially  imaged thoracic and lumbar spine. Dextroscoliosis of the lumbar spine. Degenerative changes of the bilateral hips. Age indeterminate fractures of the right L2-L4 transverse processes. Old, healed fractures of the bilateral superior and inferior pubic rami. Diffuse body wall edema. IMPRESSION: 1. Diffuse mural thickening of the  colon to the level of the rectum suggestive of pancolitis. 2. Large hiatal hernia containing the majority of the stomach. 3. Right adnexal septated cystic focus measures 5.0 x 2.9 cm. Small volume fluid within the endometrial canal. Recommend nonemergent pelvic ultrasound for further evaluation. 4. Age indeterminate fractures of the right L2-L4 transverse processes. Old, healed fractures of the bilateral superior and inferior pubic rami. 5. Indeterminate subcentimeter enhancing focus within the lower pole left kidney may reflect volume averaging artifact. Attention on follow-up. 6. Moderate left pleural effusion. 7.  Aortic Atherosclerosis (ICD10-I70.0). Electronically Signed   By: Darrin Nipper M.D.   On: 04/06/2022 12:24   DG Chest Port 1 View  Result Date: 04/06/2022 CLINICAL DATA:  Shortness of breath EXAM: PORTABLE CHEST 1 VIEW COMPARISON:  02/12/2016 FINDINGS: The heart size and mediastinal contours are within normal limits. Hiatal hernia. Aortic atherosclerosis. No focal airspace consolidation, pleural effusion, or pneumothorax. The visualized skeletal structures are unremarkable. IMPRESSION: No active disease. Electronically Signed   By: Davina Poke D.O.   On: 04/06/2022 10:50   DG HIP UNILAT WITH PELVIS 2-3 VIEWS LEFT  Result Date: 04/06/2022 CLINICAL DATA:  Pain.  Recent fall EXAM: DG HIP (WITH OR WITHOUT PELVIS) 2-3V LEFT; DG HIP (WITH OR WITHOUT PELVIS) 2-3V RIGHT COMPARISON:  None Available. FINDINGS: Diffuse bony demineralization. Chronic healed fracture deformities of the left superior and inferior pubic rami and the parasymphyseal aspect of the right pubic bone with  bulky callus formation. No evidence of acute pelvic fracture or diastasis. Bilateral hip joints are intact without fracture or dislocation. IMPRESSION: 1. No acute fracture or dislocation of the bilateral hips. 2. Chronic healed fracture deformities of the left superior and inferior pubic rami and the parasymphyseal aspect of the right pubic bone. Electronically Signed   By: Davina Poke D.O.   On: 04/06/2022 10:49   DG HIP UNILAT WITH PELVIS 2-3 VIEWS RIGHT  Result Date: 04/06/2022 CLINICAL DATA:  Pain.  Recent fall EXAM: DG HIP (WITH OR WITHOUT PELVIS) 2-3V LEFT; DG HIP (WITH OR WITHOUT PELVIS) 2-3V RIGHT COMPARISON:  None Available. FINDINGS: Diffuse bony demineralization. Chronic healed fracture deformities of the left superior and inferior pubic rami and the parasymphyseal aspect of the right pubic bone with bulky callus formation. No evidence of acute pelvic fracture or diastasis. Bilateral hip joints are intact without fracture or dislocation. IMPRESSION: 1. No acute fracture or dislocation of the bilateral hips. 2. Chronic healed fracture deformities of the left superior and inferior pubic rami and the parasymphyseal aspect of the right pubic bone. Electronically Signed   By: Davina Poke D.O.   On: 04/06/2022 10:49   DG Knee Complete 4 Views Left  Result Date: 04/06/2022 CLINICAL DATA:  Recent fall EXAM: LEFT KNEE - COMPLETE 4+ VIEW COMPARISON:  None Available. FINDINGS: No evidence of fracture, dislocation, or joint effusion. Mild tricompartmental osteoarthritis of the left knee. Soft tissues are unremarkable. IMPRESSION: Mild tricompartmental osteoarthritis of the left knee. No acute findings. Electronically Signed   By: Davina Poke D.O.   On: 04/06/2022 10:47   DG Knee Complete 4 Views Right  Result Date: 04/06/2022 CLINICAL DATA:  Recent fall EXAM: RIGHT KNEE - COMPLETE 4+ VIEW COMPARISON:  None Available. FINDINGS: No acute fracture or dislocation. No joint effusion. Moderate to  severe tricompartmental osteoarthritis is most pronounced in the lateral compartment. Bones are demineralized. No focal soft tissue swelling. IMPRESSION: 1. No acute fracture or dislocation of the right knee. 2. Moderate to severe tricompartmental osteoarthritis.  Electronically Signed   By: Davina Poke D.O.   On: 04/06/2022 10:46   CT Head Wo Contrast  Result Date: 04/06/2022 CLINICAL DATA:  89 year old female with altered mental status and possible head and neck injury. EXAM: CT HEAD WITHOUT CONTRAST CT CERVICAL SPINE WITHOUT CONTRAST TECHNIQUE: Multidetector CT imaging of the head and cervical spine was performed following the standard protocol without intravenous contrast. Multiplanar CT image reconstructions of the cervical spine were also generated. RADIATION DOSE REDUCTION: This exam was performed according to the departmental dose-optimization program which includes automated exposure control, adjustment of the mA and/or kV according to patient size and/or use of iterative reconstruction technique. COMPARISON:  08/14/2021 head CT FINDINGS: CT HEAD FINDINGS Brain: No evidence of acute infarction, hemorrhage, hydrocephalus, extra-axial collection or mass lesion/mass effect. Atrophy and chronic small-vessel white matter ischemic changes again noted. Vascular: Carotid atherosclerotic calcifications are noted. Skull: Normal. Negative for fracture or focal lesion. Sinuses/Orbits: No acute finding. Other: None. CT CERVICAL SPINE FINDINGS Alignment: 4 mm anterolisthesis of C3 on C4 is unchanged when compared to 08/14/2021 head CT scout film. Straightening of the normal cervical lordosis is noted. No acute subluxation identified. Skull base and vertebrae: No acute fracture. No primary bone lesion or focal pathologic process. Soft tissues and spinal canal: No prevertebral fluid or swelling. No visible canal hematoma. Disc levels: Multilevel degenerative disc disease, spondylosis and facet arthropathy noted.  Degenerative changes are greatest from C3-C7. These findings contribute to mild central spinal and bony foraminal narrowing at multiple levels. Upper chest: No acute abnormality. Other: None IMPRESSION: 1. No evidence of acute intracranial abnormality. Atrophy and chronic small-vessel white matter ischemic changes. 2. No static evidence of acute injury to the cervical spine. Multilevel degenerative changes as described. Electronically Signed   By: Margarette Canada M.D.   On: 04/06/2022 10:42   CT Cervical Spine Wo Contrast  Result Date: 04/06/2022 CLINICAL DATA:  89 year old female with altered mental status and possible head and neck injury. EXAM: CT HEAD WITHOUT CONTRAST CT CERVICAL SPINE WITHOUT CONTRAST TECHNIQUE: Multidetector CT imaging of the head and cervical spine was performed following the standard protocol without intravenous contrast. Multiplanar CT image reconstructions of the cervical spine were also generated. RADIATION DOSE REDUCTION: This exam was performed according to the departmental dose-optimization program which includes automated exposure control, adjustment of the mA and/or kV according to patient size and/or use of iterative reconstruction technique. COMPARISON:  08/14/2021 head CT FINDINGS: CT HEAD FINDINGS Brain: No evidence of acute infarction, hemorrhage, hydrocephalus, extra-axial collection or mass lesion/mass effect. Atrophy and chronic small-vessel white matter ischemic changes again noted. Vascular: Carotid atherosclerotic calcifications are noted. Skull: Normal. Negative for fracture or focal lesion. Sinuses/Orbits: No acute finding. Other: None. CT CERVICAL SPINE FINDINGS Alignment: 4 mm anterolisthesis of C3 on C4 is unchanged when compared to 08/14/2021 head CT scout film. Straightening of the normal cervical lordosis is noted. No acute subluxation identified. Skull base and vertebrae: No acute fracture. No primary bone lesion or focal pathologic process. Soft tissues and spinal  canal: No prevertebral fluid or swelling. No visible canal hematoma. Disc levels: Multilevel degenerative disc disease, spondylosis and facet arthropathy noted. Degenerative changes are greatest from C3-C7. These findings contribute to mild central spinal and bony foraminal narrowing at multiple levels. Upper chest: No acute abnormality. Other: None IMPRESSION: 1. No evidence of acute intracranial abnormality. Atrophy and chronic small-vessel white matter ischemic changes. 2. No static evidence of acute injury to the cervical spine. Multilevel degenerative changes as  described. Electronically Signed   By: Margarette Canada M.D.   On: 04/06/2022 10:42     Scheduled Meds:  calcium carbonate  400 mg of elemental calcium Oral BID   famotidine  10 mg Oral QHS   heparin  5,000 Units Subcutaneous Q8H   nystatin-triamcinolone   Topical BID   sodium chloride flush  3 mL Intravenous Q12H   sodium chloride flush  3 mL Intravenous Q12H   thyroid  60 mg Oral QAC breakfast   vancomycin  125 mg Oral QID   Continuous Infusions:  sodium chloride     sodium chloride 100 mL/hr at 04/07/22 1118     LOS: 1 day    Roxan Hockey M.D on 04/07/2022 at 1:43 PM  Go to [email or web address] - for contact info  [facility] - Office  [phone]  If 7PM-7AM, please contact night-coverage [email or web address] 04/07/2022, 1:43 PM

## 2022-04-08 DIAGNOSIS — A0472 Enterocolitis due to Clostridium difficile, not specified as recurrent: Secondary | ICD-10-CM | POA: Diagnosis not present

## 2022-04-08 LAB — BASIC METABOLIC PANEL
Anion gap: 6 (ref 5–15)
BUN: 10 mg/dL (ref 8–23)
CO2: 18 mmol/L — ABNORMAL LOW (ref 22–32)
Calcium: 7.4 mg/dL — ABNORMAL LOW (ref 8.9–10.3)
Chloride: 110 mmol/L (ref 98–111)
Creatinine, Ser: 0.56 mg/dL (ref 0.44–1.00)
GFR, Estimated: >60 mL/min (ref >60)
Glucose, Bld: 98 mg/dL (ref 70–99)
Potassium: 3.4 mmol/L — ABNORMAL LOW (ref 3.5–5.1)
Sodium: 134 mmol/L — ABNORMAL LOW (ref 135–145)

## 2022-04-08 LAB — MAGNESIUM: Magnesium: 1.9 mg/dL (ref 1.7–2.4)

## 2022-04-08 MED ORDER — POTASSIUM CHLORIDE CRYS ER 20 MEQ PO TBCR
40.0000 meq | EXTENDED_RELEASE_TABLET | Freq: Once | ORAL | Status: AC
  Administered 2022-04-08: 40 meq via ORAL
  Filled 2022-04-08: qty 2

## 2022-04-08 MED ORDER — VANCOMYCIN HCL 125 MG PO CAPS: 125.0000 mg | ORAL_CAPSULE | Freq: Four times a day (QID) | ORAL | 0 refills | Status: DC

## 2022-04-08 MED ORDER — VANCOMYCIN HCL 125 MG PO CAPS: 125.0000 mg | ORAL_CAPSULE | Freq: Four times a day (QID) | ORAL | 0 refills | Status: AC

## 2022-04-08 NOTE — Care Management Important Message (Signed)
Important Message  Patient Details  Name: Adriana Peters MRN: [id number] Date of Birth: Feb 17, 1928   Medicare Important Message Given:  N/A - LOS <3 / Initial given by admissions     Tommy Medal 04/08/2022, 10:16 AM

## 2022-04-08 NOTE — Discharge Summary (Signed)
Physician Discharge Summary  Adriana Peters [id number] DOB: [birth year] DOA: 04/06/2022  PCP: [name], [name], MD  Admit date: 04/06/2022  Discharge date: 04/08/2022  Admitted From:Home  Disposition:  Home  Recommendations for Outpatient Follow-up:  Follow up with PCP in 1-2 weeks Continue on vancomycin as prescribed for 8 more days to complete total 10-day course of treatment Consider outpatient follow-up for abnormal adnexal finding with pelvic ultrasound if desired, further evaluation deferred inpatient given advanced age Continue on other home medications as prior  Home Health: Yes with PT  Equipment/Devices: Walker  Discharge Condition:Stable  CODE STATUS: Full  Diet recommendation: Heart Healthy  Brief/Interim Summary: 89 y.o. female with past medical history relevant for hiatal hernia with GERD, osteoporosis, hypothyroidism admitted on 04/06/2022 with c diff colitis and electrolyte derangement.  She was started on IV fluids for dehydration and was started on oral vancomycin as well.  She is doing much better this morning and denies frequent bowel movements and is stable for discharge.  She will remain on vancomycin for 8 more days to complete total 10-day course of treatment and will follow-up with PCP outpatient.  PT evaluation revealing need for home health services and walker.  Discharge Diagnoses:  Principal Problem:   C. difficile colitis Active Problems:   Hiatal hernia with GERD   Hypothyroidism   Chronic lower back pain   Colitis  Principal discharge diagnosis: C. difficile colitis with associated electrolyte abnormalities in the setting of dehydration.  Discharge Instructions  Discharge Instructions     Diet - low sodium heart healthy   Complete by: As directed    For home use only DME Walker   Complete by: As directed    Patient needs a walker to treat with the following condition: Weakness   Increase activity slowly   Complete by: As directed        Allergies as of 04/08/2022   No Known Allergies      Medication List     TAKE these medications    thyroid 60 MG tablet Commonly known as: Armour Thyroid TAKE 1 TABLET BY MOUTH EVERY DAY WITH BREAKFAST   vancomycin 125 MG capsule Commonly known as: VANCOCIN Take 1 capsule (125 mg total) by mouth 4 (four) times daily for 8 days.               Durable Medical Equipment  (From admission, onward)           Start     Ordered   04/08/22 0000  For home use only DME Walker       Question:  Patient needs a walker to treat with the following condition  Answer:  Weakness   04/08/22 1017            Follow-up Information     [name], [name], MD. Schedule an appointment as soon as possible for a visit in 1 week(s).   Specialty: Family Medicine Contact information: [place] [address] [phone]                No Known Allergies  Consultations: None   Procedures/Studies: CT ABDOMEN PELVIS W CONTRAST  Result Date: 04/06/2022 CLINICAL DATA:  History of fall 2 weeks ago with generalized abdominal pain and altered mental status EXAM: CT ABDOMEN AND PELVIS WITH CONTRAST TECHNIQUE: Multidetector CT imaging of the abdomen and pelvis was performed using the standard protocol following bolus administration of intravenous contrast. RADIATION DOSE REDUCTION: This exam was performed according  to the departmental dose-optimization program which includes automated exposure control, adjustment of the mA and/or kV according to patient size and/or use of iterative reconstruction technique. CONTRAST:  18m OMNIPAQUE IOHEXOL 300 MG/ML  SOLN COMPARISON:  None Available. FINDINGS: Lower chest: No focal consolidation or pulmonary nodule in the lung bases. Moderate left pleural effusion. Partially imaged heart size is normal. Hepatobiliary: The superior most aspect of the liver is not included within the field of view on portal venous phase but is included  within the renal delay. No focal hepatic lesions. No intra or extrahepatic biliary ductal dilation. Normal gallbladder. Pancreas: No focal lesions or main ductal dilation. Spleen: Normal in size without focal abnormality. Adrenals/Urinary Tract: No adrenal nodules. No renal calculi or hydronephrosis. A prominent focus of subcentimeter enhancement within the lower pole left kidney (5:48) is indeterminate. Right subcentimeter hypodensities, too small to characterize but likely cysts. No focal bladder wall thickening. Stomach/Bowel: Large hiatal hernia containing the majority of the stomach. Diffuse mural thickening of the colon to the level of the rectum. Mild mural thickening of the terminal ileum, likely reactive. Colonic diverticulosis without acute diverticulitis. Normal appendix. Vascular/Lymphatic: Aortic atherosclerosis. No enlarged abdominal or pelvic lymph nodes. Reproductive: Small volume fluid within the endometrial canal. Right adnexal septated cystic focus measures 5.0 by 2.9 cm (2:51). No left adnexal mass. Other: No free fluid, fluid collection, or free air. Musculoskeletal: No acute or abnormal lytic or blastic osseous lesions. Multilevel degenerative changes of the partially imaged thoracic and lumbar spine. Dextroscoliosis of the lumbar spine. Degenerative changes of the bilateral hips. Age indeterminate fractures of the right L2-L4 transverse processes. Old, healed fractures of the bilateral superior and inferior pubic rami. Diffuse body wall edema. IMPRESSION: 1. Diffuse mural thickening of the colon to the level of the rectum suggestive of pancolitis. 2. Large hiatal hernia containing the majority of the stomach. 3. Right adnexal septated cystic focus measures 5.0 x 2.9 cm. Small volume fluid within the endometrial canal. Recommend nonemergent pelvic ultrasound for further evaluation. 4. Age indeterminate fractures of the right L2-L4 transverse processes. Old, healed fractures of the bilateral  superior and inferior pubic rami. 5. Indeterminate subcentimeter enhancing focus within the lower pole left kidney may reflect volume averaging artifact. Attention on follow-up. 6. Moderate left pleural effusion. 7.  Aortic Atherosclerosis (ICD10-I70.0). Electronically Signed   By: LDarrin NipperM.D.   On: 04/06/2022 12:24   DG Chest Port 1 View  Result Date: 04/06/2022 CLINICAL DATA:  Shortness of breath EXAM: PORTABLE CHEST 1 VIEW COMPARISON:  02/12/2016 FINDINGS: The heart size and mediastinal contours are within normal limits. Hiatal hernia. Aortic atherosclerosis. No focal airspace consolidation, pleural effusion, or pneumothorax. The visualized skeletal structures are unremarkable. IMPRESSION: No active disease. Electronically Signed   By: NDavina PokeD.O.   On: 04/06/2022 10:50   DG HIP UNILAT WITH PELVIS 2-3 VIEWS LEFT  Result Date: 04/06/2022 CLINICAL DATA:  Pain.  Recent fall EXAM: DG HIP (WITH OR WITHOUT PELVIS) 2-3V LEFT; DG HIP (WITH OR WITHOUT PELVIS) 2-3V RIGHT COMPARISON:  None Available. FINDINGS: Diffuse bony demineralization. Chronic healed fracture deformities of the left superior and inferior pubic rami and the parasymphyseal aspect of the right pubic bone with bulky callus formation. No evidence of acute pelvic fracture or diastasis. Bilateral hip joints are intact without fracture or dislocation. IMPRESSION: 1. No acute fracture or dislocation of the bilateral hips. 2. Chronic healed fracture deformities of the left superior and inferior pubic rami and the parasymphyseal aspect of  the right pubic bone. Electronically Signed   By: Davina Poke D.O.   On: 04/06/2022 10:49   DG HIP UNILAT WITH PELVIS 2-3 VIEWS RIGHT  Result Date: 04/06/2022 CLINICAL DATA:  Pain.  Recent fall EXAM: DG HIP (WITH OR WITHOUT PELVIS) 2-3V LEFT; DG HIP (WITH OR WITHOUT PELVIS) 2-3V RIGHT COMPARISON:  None Available. FINDINGS: Diffuse bony demineralization. Chronic healed fracture deformities of the left  superior and inferior pubic rami and the parasymphyseal aspect of the right pubic bone with bulky callus formation. No evidence of acute pelvic fracture or diastasis. Bilateral hip joints are intact without fracture or dislocation. IMPRESSION: 1. No acute fracture or dislocation of the bilateral hips. 2. Chronic healed fracture deformities of the left superior and inferior pubic rami and the parasymphyseal aspect of the right pubic bone. Electronically Signed   By: Davina Poke D.O.   On: 04/06/2022 10:49   DG Knee Complete 4 Views Left  Result Date: 04/06/2022 CLINICAL DATA:  Recent fall EXAM: LEFT KNEE - COMPLETE 4+ VIEW COMPARISON:  None Available. FINDINGS: No evidence of fracture, dislocation, or joint effusion. Mild tricompartmental osteoarthritis of the left knee. Soft tissues are unremarkable. IMPRESSION: Mild tricompartmental osteoarthritis of the left knee. No acute findings. Electronically Signed   By: Davina Poke D.O.   On: 04/06/2022 10:47   DG Knee Complete 4 Views Right  Result Date: 04/06/2022 CLINICAL DATA:  Recent fall EXAM: RIGHT KNEE - COMPLETE 4+ VIEW COMPARISON:  None Available. FINDINGS: No acute fracture or dislocation. No joint effusion. Moderate to severe tricompartmental osteoarthritis is most pronounced in the lateral compartment. Bones are demineralized. No focal soft tissue swelling. IMPRESSION: 1. No acute fracture or dislocation of the right knee. 2. Moderate to severe tricompartmental osteoarthritis. Electronically Signed   By: Davina Poke D.O.   On: 04/06/2022 10:46   CT Head Wo Contrast  Result Date: 04/06/2022 CLINICAL DATA:  89 year old female with altered mental status and possible head and neck injury. EXAM: CT HEAD WITHOUT CONTRAST CT CERVICAL SPINE WITHOUT CONTRAST TECHNIQUE: Multidetector CT imaging of the head and cervical spine was performed following the standard protocol without intravenous contrast. Multiplanar CT image reconstructions of the  cervical spine were also generated. RADIATION DOSE REDUCTION: This exam was performed according to the departmental dose-optimization program which includes automated exposure control, adjustment of the mA and/or kV according to patient size and/or use of iterative reconstruction technique. COMPARISON:  08/14/2021 head CT FINDINGS: CT HEAD FINDINGS Brain: No evidence of acute infarction, hemorrhage, hydrocephalus, extra-axial collection or mass lesion/mass effect. Atrophy and chronic small-vessel white matter ischemic changes again noted. Vascular: Carotid atherosclerotic calcifications are noted. Skull: Normal. Negative for fracture or focal lesion. Sinuses/Orbits: No acute finding. Other: None. CT CERVICAL SPINE FINDINGS Alignment: 4 mm anterolisthesis of C3 on C4 is unchanged when compared to 08/14/2021 head CT scout film. Straightening of the normal cervical lordosis is noted. No acute subluxation identified. Skull base and vertebrae: No acute fracture. No primary bone lesion or focal pathologic process. Soft tissues and spinal canal: No prevertebral fluid or swelling. No visible canal hematoma. Disc levels: Multilevel degenerative disc disease, spondylosis and facet arthropathy noted. Degenerative changes are greatest from C3-C7. These findings contribute to mild central spinal and bony foraminal narrowing at multiple levels. Upper chest: No acute abnormality. Other: None IMPRESSION: 1. No evidence of acute intracranial abnormality. Atrophy and chronic small-vessel white matter ischemic changes. 2. No static evidence of acute injury to the cervical spine. Multilevel degenerative changes as  described. Electronically Signed   By: Margarette Canada M.D.   On: 04/06/2022 10:42   CT Cervical Spine Wo Contrast  Result Date: 04/06/2022 CLINICAL DATA:  89 year old female with altered mental status and possible head and neck injury. EXAM: CT HEAD WITHOUT CONTRAST CT CERVICAL SPINE WITHOUT CONTRAST TECHNIQUE: Multidetector  CT imaging of the head and cervical spine was performed following the standard protocol without intravenous contrast. Multiplanar CT image reconstructions of the cervical spine were also generated. RADIATION DOSE REDUCTION: This exam was performed according to the departmental dose-optimization program which includes automated exposure control, adjustment of the mA and/or kV according to patient size and/or use of iterative reconstruction technique. COMPARISON:  08/14/2021 head CT FINDINGS: CT HEAD FINDINGS Brain: No evidence of acute infarction, hemorrhage, hydrocephalus, extra-axial collection or mass lesion/mass effect. Atrophy and chronic small-vessel white matter ischemic changes again noted. Vascular: Carotid atherosclerotic calcifications are noted. Skull: Normal. Negative for fracture or focal lesion. Sinuses/Orbits: No acute finding. Other: None. CT CERVICAL SPINE FINDINGS Alignment: 4 mm anterolisthesis of C3 on C4 is unchanged when compared to 08/14/2021 head CT scout film. Straightening of the normal cervical lordosis is noted. No acute subluxation identified. Skull base and vertebrae: No acute fracture. No primary bone lesion or focal pathologic process. Soft tissues and spinal canal: No prevertebral fluid or swelling. No visible canal hematoma. Disc levels: Multilevel degenerative disc disease, spondylosis and facet arthropathy noted. Degenerative changes are greatest from C3-C7. These findings contribute to mild central spinal and bony foraminal narrowing at multiple levels. Upper chest: No acute abnormality. Other: None IMPRESSION: 1. No evidence of acute intracranial abnormality. Atrophy and chronic small-vessel white matter ischemic changes. 2. No static evidence of acute injury to the cervical spine. Multilevel degenerative changes as described. Electronically Signed   By: Margarette Canada M.D.   On: 04/06/2022 10:42     Discharge Exam: Vitals:   04/07/22 2128 04/08/22 0702  BP: 112/61 (!) 111/58   Pulse: 82 65  Resp: 18 15  Temp: 97.9 F (36.6 C)   SpO2: 98% 100%   Vitals:   04/07/22 0453 04/07/22 1754 04/07/22 2128 04/08/22 0702  BP: 125/60 (!) 108/54 112/61 (!) 111/58  Pulse: 85 84 82 65  Resp: '16  18 15  '$ Temp: 97.7 F (36.5 C) 97.8 F (36.6 C) 97.9 F (36.6 C)   TempSrc:   Oral   SpO2: 97% 100% 98% 100%  Weight:      Height:        General: Pt is alert, awake, not in acute distress Cardiovascular: RRR, S1/S2 +, no rubs, no gallops Respiratory: CTA bilaterally, no wheezing, no rhonchi Abdominal: Soft, NT, ND, bowel sounds + Extremities: no edema, no cyanosis    The results of significant diagnostics from this hospitalization (including imaging, microbiology, ancillary and laboratory) are listed below for reference.     Microbiology: Recent Results (from the past 240 hour(s))  C Difficile Quick Screen w PCR reflex     Status: Abnormal   Collection Time: 04/06/22 12:55 PM   Specimen: Stool  Result Value Ref Range Status   C Diff antigen POSITIVE (A) NEGATIVE Final   C Diff toxin POSITIVE (A) NEGATIVE Final   C Diff interpretation Toxin producing C. difficile detected.  Final    Comment: CRITICAL RESULT CALLED TO, READ BACK BY AND VERIFIED WITH: BERNARD @ [id number] ON [id number] BY HENDERSON L Performed at [facility], [address] [place], [place] [address]   Gastrointestinal Panel by PCR ,  Stool     Status: None   Collection Time: 04/06/22 12:55 PM   Specimen: Stool  Result Value Ref Range Status   Campylobacter species NOT DETECTED NOT DETECTED Final   Plesimonas shigelloides NOT DETECTED NOT DETECTED Final   Salmonella species NOT DETECTED NOT DETECTED Final   Yersinia enterocolitica NOT DETECTED NOT DETECTED Final   Vibrio species NOT DETECTED NOT DETECTED Final   Vibrio cholerae NOT DETECTED NOT DETECTED Final   Enteroaggregative E coli (EAEC) NOT DETECTED NOT DETECTED Final   Enteropathogenic E coli (EPEC) NOT DETECTED NOT DETECTED Final    Enterotoxigenic E coli (ETEC) NOT DETECTED NOT DETECTED Final   Shiga like toxin producing E coli (STEC) NOT DETECTED NOT DETECTED Final   Shigella/Enteroinvasive E coli (EIEC) NOT DETECTED NOT DETECTED Final   Cryptosporidium NOT DETECTED NOT DETECTED Final   Cyclospora cayetanensis NOT DETECTED NOT DETECTED Final   Entamoeba histolytica NOT DETECTED NOT DETECTED Final   Giardia lamblia NOT DETECTED NOT DETECTED Final   Adenovirus F40/41 NOT DETECTED NOT DETECTED Final   Astrovirus NOT DETECTED NOT DETECTED Final   Norovirus GI/GII NOT DETECTED NOT DETECTED Final   Rotavirus A NOT DETECTED NOT DETECTED Final   Sapovirus (I, II, IV, and V) NOT DETECTED NOT DETECTED Final    Comment: Performed at [facility], [address] [place], [place] [address]  Culture, blood (Routine X 2) w Reflex to ID Panel     Status: None (Preliminary result)   Collection Time: 04/06/22  1:20 PM   Specimen: BLOOD  Result Value Ref Range Status   Specimen Description BLOOD BLOOD RIGHT HAND  Final   Special Requests NONE  Final   Culture   Final    NO GROWTH 2 DAYS Performed at [facility], [address], [place], [place] [address]    Report Status PENDING  Incomplete  Culture, blood (Routine X 2) w Reflex to ID Panel     Status: None (Preliminary result)   Collection Time: 04/06/22  1:20 PM   Specimen: Left Antecubital; Blood  Result Value Ref Range Status   Specimen Description LEFT ANTECUBITAL BOTTLES DRAWN AEROBIC ONLY  Final   Special Requests Blood Culture adequate volume  Final   Culture   Final    NO GROWTH 2 DAYS Performed at [facility], [address], [place], [place] [address]    Report Status PENDING  Incomplete     Labs: BNP (last 3 results) Recent Labs    04/06/22 1010  BNP 0000000*   Basic Metabolic Panel: Recent Labs  Lab 04/06/22 1010 04/07/22 0449 04/08/22 0837  NA 130* 134* 134*  K 3.7 3.4* 3.4*  CL 97* 105 110  CO2 20* 18* 18*  GLUCOSE 122* 83 98  BUN  24* 17 10  CREATININE 0.86 0.61 0.56  CALCIUM 7.8* 7.3* 7.4*  MG 1.9  --  1.9   Liver Function Tests: Recent Labs  Lab 04/06/22 1010  AST 46*  ALT 35  ALKPHOS 92  BILITOT 1.1  PROT 5.6*  ALBUMIN 2.5*   No results for input(s): "LIPASE", "AMYLASE" in the last 168 hours. No results for input(s): "AMMONIA" in the last 168 hours. CBC: Recent Labs  Lab 04/06/22 1010 04/07/22 0449  WBC 33.0* 22.5*  NEUTROABS 30.0*  --   HGB 12.5 10.4*  HCT 37.4 31.3*  MCV 88.4 90.2  PLT 551* 469*   Cardiac Enzymes: No results for input(s): "CKTOTAL", "CKMB", "CKMBINDEX", "TROPONINI" in the last 168  hours. BNP: Invalid input(s): "POCBNP" CBG: No results for input(s): "GLUCAP" in the last 168 hours. D-Dimer No results for input(s): "DDIMER" in the last 72 hours. Hgb A1c No results for input(s): "HGBA1C" in the last 72 hours. Lipid Profile No results for input(s): "CHOL", "HDL", "LDLCALC", "TRIG", "CHOLHDL", "LDLDIRECT" in the last 72 hours. Thyroid function studies Recent Labs    04/06/22 1010  TSH 7.514*   Anemia work up No results for input(s): "VITAMINB12", "FOLATE", "FERRITIN", "TIBC", "IRON", "RETICCTPCT" in the last 72 hours. Urinalysis    Component Value Date/Time   COLORURINE YELLOW 04/06/2022 1255   APPEARANCEUR HAZY (A) 04/06/2022 1255   LABSPEC 1.029 04/06/2022 1255   PHURINE 5.0 04/06/2022 1255   GLUCOSEU NEGATIVE 04/06/2022 1255   HGBUR NEGATIVE 04/06/2022 1255   BILIRUBINUR NEGATIVE 04/06/2022 1255   KETONESUR 20 (A) 04/06/2022 1255   PROTEINUR NEGATIVE 04/06/2022 1255   NITRITE NEGATIVE 04/06/2022 1255   LEUKOCYTESUR NEGATIVE 04/06/2022 1255   Sepsis Labs Recent Labs  Lab 04/06/22 1010 04/07/22 0449  WBC 33.0* 22.5*   Microbiology Recent Results (from the past 240 hour(s))  C Difficile Quick Screen w PCR reflex     Status: Abnormal   Collection Time: 04/06/22 12:55 PM   Specimen: Stool  Result Value Ref Range Status   C Diff antigen POSITIVE (A)  NEGATIVE Final   C Diff toxin POSITIVE (A) NEGATIVE Final   C Diff interpretation Toxin producing C. difficile detected.  Final    Comment: CRITICAL RESULT CALLED TO, READ BACK BY AND VERIFIED WITH: BERNARD @ [place] ON ET:[id number] BY HENDERSON L Performed at [facility], [address], [place], [place] [address]   Gastrointestinal Panel by PCR , Stool     Status: None   Collection Time: 04/06/22 12:55 PM   Specimen: Stool  Result Value Ref Range Status   Campylobacter species NOT DETECTED NOT DETECTED Final   Plesimonas shigelloides NOT DETECTED NOT DETECTED Final   Salmonella species NOT DETECTED NOT DETECTED Final   Yersinia enterocolitica NOT DETECTED NOT DETECTED Final   Vibrio species NOT DETECTED NOT DETECTED Final   Vibrio cholerae NOT DETECTED NOT DETECTED Final   Enteroaggregative E coli (EAEC) NOT DETECTED NOT DETECTED Final   Enteropathogenic E coli (EPEC) NOT DETECTED NOT DETECTED Final   Enterotoxigenic E coli (ETEC) NOT DETECTED NOT DETECTED Final   Shiga like toxin producing E coli (STEC) NOT DETECTED NOT DETECTED Final   Shigella/Enteroinvasive E coli (EIEC) NOT DETECTED NOT DETECTED Final   Cryptosporidium NOT DETECTED NOT DETECTED Final   Cyclospora cayetanensis NOT DETECTED NOT DETECTED Final   Entamoeba histolytica NOT DETECTED NOT DETECTED Final   Giardia lamblia NOT DETECTED NOT DETECTED Final   Adenovirus F40/41 NOT DETECTED NOT DETECTED Final   Astrovirus NOT DETECTED NOT DETECTED Final   Norovirus GI/GII NOT DETECTED NOT DETECTED Final   Rotavirus A NOT DETECTED NOT DETECTED Final   Sapovirus (I, II, IV, and V) NOT DETECTED NOT DETECTED Final    Comment: Performed at [facility], [address] [place], [place] [address]  Culture, blood (Routine X 2) w Reflex to ID Panel     Status: None (Preliminary result)   Collection Time: 04/06/22  1:20 PM   Specimen: BLOOD  Result Value Ref Range Status   Specimen Description BLOOD BLOOD RIGHT HAND  Final    Special Requests NONE  Final   Culture   Final    NO GROWTH 2 DAYS Performed at [facility],  [address] [place], [place] [address]    Report Status PENDING  Incomplete  Culture, blood (Routine X 2) w Reflex to ID Panel     Status: None (Preliminary result)   Collection Time: 04/06/22  1:20 PM   Specimen: Left Antecubital; Blood  Result Value Ref Range Status   Specimen Description LEFT ANTECUBITAL BOTTLES DRAWN AEROBIC ONLY  Final   Special Requests Blood Culture adequate volume  Final   Culture   Final    NO GROWTH 2 DAYS Performed at [facility], [address] [place], [place] [address]    Report Status PENDING  Incomplete     Time coordinating discharge: 35 minutes  SIGNED:   Rodena Goldmann, DO [facility] 04/08/2022, 10:17 AM  If 7PM-7AM, please contact night-coverage [email or web address]

## 2022-04-08 NOTE — Progress Notes (Signed)
Attempted to contact husband x3 with no success. Husband should be visiting with patient soon and will work on discharging patient at that time.

## 2022-04-08 NOTE — TOC Transition Note (Signed)
Transition of Care [facility]) - CM/SW Discharge Note   Patient Details  Name: Adriana Peters MRN: [id number] Date of Birth: 1928-10-07  [facility] CM/SW Contact:  Boneta Lucks, RN Phone Number: 04/08/2022, 11:05 AM   Clinical Narrative:   Patient discharging home. PT recommending HHPT. Patient states she and her husband manage well. 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  She walked 60 feet with PT today.    Final next level of care: Home/Self Care Barriers to Discharge: Continued Medical Work up   Patient Goals and CMS Choice CMS Medicare.gov Compare Post Acute Care list provided to:: Patient Choice offered to / list presented to : Patient  Discharge Placement      Patient and family notified of of transfer: 04/08/22  Discharge Plan and Services Additional resources added to the After Visit Summary for        Social Determinants of Health (SDOH) Interventions SDOH Screenings   Food Insecurity: No Food Insecurity (04/07/2022)  Housing: Low Risk  (04/07/2022)  Transportation Needs: No Transportation Needs (04/07/2022)  Utilities: Not At Risk (04/07/2022)  Depression (PHQ2-9): Low Risk  (10/27/2019)  Financial Resource Strain: Low Risk  (10/27/2019)  Physical Activity: Sufficiently Active (10/27/2019)  Social Connections: Moderately Isolated (10/27/2019)  Stress: No Stress Concern Present (10/27/2019)  Tobacco Use: Low Risk  (04/06/2022)     Readmission Risk Interventions    04/08/2022   11:02 AM  Readmission Risk Prevention Plan  Post Dischage Appt Complete  Medication Screening Complete  Transportation Screening Complete

## 2022-04-08 NOTE — Evaluation (Signed)
Physical Therapy Evaluation Patient Details Name: Adriana Peters MRN: [id number] DOB: 08/25/1928 Today's Date: 04/08/2022  History of Present Illness  Adriana Peters  is a 89 y.o. female with past medical history relevant for hiatal hernia with GERD, osteoporosis, hypothyroidism who presents by EMS to the ED with diarrhea concerns  -History is limited due to patient's cognitive and memory deficits  -Unable to reach patient's husband who she lives with who is 49 years old  -Per EMS crew apparently patient has had a fall at home a couple of weeks ago and she has been weak and unable to get permission  -Patient tells me diarrhea has persisted for at least 3 days--- further history is limited  -Apparently no fevers and no vomiting  -No chest pains no palpitations no dizziness no shortness of breath   Clinical Impression  Patient demonstrates good return for sitting up at bedside, has to lean on nearby objects for support when taking steps, transferring without AD, safer using RW with good carryover demonstrated for walking in room/hallway without loss of balance.  Patient tolerated sitting up in chair after therapy - nursing staff aware.  PLAN:  Patient to be discharged home today and discharged from acute physical therapy to care of nursing for ambulation as tolerated for length of stay with recommendations stated below         Recommendations for follow up therapy are one component of a multi-disciplinary discharge planning process, led by the attending physician.  Recommendations may be updated based on patient status, additional functional criteria and insurance authorization.  Follow Up Recommendations Home health PT      Assistance Recommended at Discharge Set up Supervision/Assistance  Patient can return home with the following  A little help with walking and/or transfers;A little help with bathing/dressing/bathroom;Help with stairs or ramp for entrance;Assistance with cooking/housework     Equipment Recommendations None recommended by [employer] walker (2 wheels)  Recommendations for Other Services       Functional Status Assessment Patient has had a recent decline in their functional status and demonstrates the ability to make significant improvements in function in a reasonable and predictable amount of time.     Precautions / Restrictions Precautions Precautions: Fall Restrictions Weight Bearing Restrictions: No      Mobility  Bed Mobility Overal bed mobility: Modified Independent                  Transfers Overall transfer level: Needs assistance Equipment used: Rolling walker (2 wheels), None, 1 person hand held assist Transfers: Sit to/from Stand, Bed to chair/wheelchair/BSC Sit to Stand: Min guard   Step pivot transfers: Min guard       General transfer comment: has to lean on nearby objects for support when transferring without AD, safer using RW    Ambulation/Gait Ambulation/Gait assistance: Min guard Gait Distance (Feet): 60 Feet Assistive device: Rolling walker (2 wheels) Gait Pattern/deviations: Decreased step length - right, Decreased step length - left, Decreased stride length, Trunk flexed Gait velocity: decreased     General Gait Details: slow labored cadence without loss of balance, limited mostly due to fatigue  Stairs            Wheelchair Mobility    Modified Rankin (Stroke Patients Only)       Balance Overall balance assessment: Needs assistance Sitting-balance support: Feet supported, No upper extremity supported Sitting balance-Leahy Scale: Good Sitting balance - Comments: seated at EOB   Standing balance support: During functional activity, No upper  extremity supported Standing balance-Leahy Scale: Poor Standing balance comment: fair/good using RW                             Pertinent Vitals/Pain Pain Assessment Pain Assessment: No/denies pain    Home Living Family/patient expects to be  discharged to:: Private residence Living Arrangements: Spouse/significant other Available Help at Discharge: Family;Available 24 hours/day Type of Home: House Home Access: Stairs to enter Entrance Stairs-Rails: None Entrance Stairs-Number of Steps: 2   Home Layout: One level Home Equipment: None      Prior Function Prior Level of Function : Independent/Modified Independent;Driving             Mobility Comments: Community ambulator without AD, drives ADLs Comments: Independent     Hand Dominance        Extremity/Trunk Assessment             Cervical / Trunk Assessment Cervical / Trunk Assessment: Kyphotic  Communication   Communication: No difficulties  Cognition Arousal/Alertness: Awake/alert Behavior During Therapy: WFL for tasks assessed/performed Overall Cognitive Status: Within Functional Limits for tasks assessed                                          General Comments      Exercises     Assessment/Plan    PT Assessment All further PT needs can be met in the next venue of care  PT Problem List Decreased strength;Decreased activity tolerance;Decreased balance;Decreased mobility       PT Treatment Interventions      PT Goals (Current goals can be found in the Care Plan section)  Acute Rehab PT Goals Patient Stated Goal: return home with family to assist PT Goal Formulation: With patient Time For Goal Achievement: 04/08/22 Potential to Achieve Goals: Good    Frequency       Co-evaluation               AM-PAC PT "6 Clicks" Mobility  Outcome Measure Help needed turning from your back to your side while in a flat bed without using bedrails?: None Help needed moving from lying on your back to sitting on the side of a flat bed without using bedrails?: None Help needed moving to and from a bed to a chair (including a wheelchair)?: A Little Help needed standing up from a chair using your arms (e.g., wheelchair or bedside  chair)?: A Little Help needed to walk in hospital room?: A Little Help needed climbing 3-5 steps with a railing? : A Lot 6 Click Score: 19    End of Session   Activity Tolerance: Patient tolerated treatment well;Patient limited by fatigue Patient left: in chair;with call bell/phone within reach Nurse Communication: Mobility status PT Visit Diagnosis: Unsteadiness on feet (R26.81);Other abnormalities of gait and mobility (R26.89);Muscle weakness (generalized) (M62.81)    Time: [id number] PT Time Calculation (min) (ACUTE ONLY): 20 min   Charges:   PT Evaluation $PT Eval Moderate Complexity: 1 Mod PT Treatments $Therapeutic Activity: 8-22 mins        11:00 AM, 04/08/22 Lonell Grandchild, MPT Physical Therapist with [facility] [phone] office [phone] mobile phone

## 2022-04-08 NOTE — Progress Notes (Signed)
Husband here and states he does not feel comfortable transporting pt in his POV due to her difficulty getting in & out of truck and distance from carport to house. After discussing options with SW and pt/husband, EMS transport decided to be safest option for patient. [facility] called and transportation arranged. Pt and husband aware and agreeable.

## 2022-04-09 ENCOUNTER — Telehealth: Payer: Self-pay

## 2022-04-09 NOTE — Transitions of Care (Post Inpatient/ED Visit) (Signed)
   04/09/2022  Name: Adriana Peters MRN: [id number] DOB: [birth year]  Today's TOC FU Call Status: Today's TOC FU Call Status:: Successful TOC FU Call Competed TOC FU Call Complete Date: 04/09/22  Transition Care Management Follow-up Telephone Call Date of Discharge: 04/08/22 Discharge [facility] Type of Discharge: Inpatient Admission Primary Inpatient Discharge Diagnosis:: "coliits" How have you been since you were released from the hospital?: Better (patient states she rested well last night. She has not had a meal yet. Denies any abd pain or GI issues. LBM was yesterday and normal per patient report) Any questions or concerns?: No  Items Reviewed: Did you receive and understand the discharge instructions provided?: Yes Medications obtained and verified?: Yes (Medications Reviewed) Any new allergies since your discharge?: No Dietary orders reviewed?: Yes Type of Diet Ordered:: low salt/heart healthy Do you have support at home?: Yes People in Home: spouse Name of Support/Comfort Primary Source: [facility] and Equipment/Supplies: [place] Ordered?: NA Any new equipment or medical supplies ordered?: NA  Functional Questionnaire: Do you need assistance with bathing/showering or dressing?: No Do you need assistance with meal preparation?: No Do you need assistance with eating?: No Do you have difficulty maintaining continence: No Do you need assistance with getting out of bed/getting out of a chair/moving?: No Do you have difficulty managing or taking your medications?: No  Folllow up appointments reviewed: PCP Follow-up appointment confirmed?: Yes Date of PCP follow-up appointment?: 04/12/22 Follow-up Provider: Dr. [name] Specialist [facility] Follow-up appointment confirmed?: NA Do you need transportation to your follow-up appointment?: No Do you understand care options if your condition(s) worsen?: Yes-patient verbalized  understanding  SDOH Interventions Today    Flowsheet Row Most Recent Value  SDOH Interventions   Food Insecurity Interventions Intervention Not Indicated  Transportation Interventions Intervention Not Indicated      TOC Interventions Today    Flowsheet Row Most Recent Value  TOC Interventions   TOC Interventions Discussed/Reviewed TOC Interventions Discussed, Arranged PCP follow up within 7 days/Care Guide scheduled      Interventions Today    Flowsheet Row Most Recent Value  General Interventions   General Interventions Discussed/Reviewed General Interventions Discussed, Doctor Visits  Doctor Visits Discussed/Reviewed PCP  PCP/Specialist Visits Compliance with follow-up visit  Education Interventions   Education Provided Provided Education  Provided Verbal Education On Nutrition, When to see the doctor, Medication  Nutrition Interventions   Nutrition Discussed/Reviewed Nutrition Discussed, Decreasing salt  Pharmacy Interventions   Pharmacy Dicussed/Reviewed Pharmacy Topics Discussed, Medications and their functions  Safety Interventions   Safety Discussed/Reviewed Safety Discussed, Fall Risk  [pt refused HH services whle inpatient-statesshe is ambulating and getting around without any issues-has cane and walker if needed]       Enzo Montgomery, RN,BSN,CCM [facility] Care Management Care Management Community Coordinator Direct Phone: [phone] Toll Free: [phone] Fax: [phone]

## 2022-04-10 NOTE — Progress Notes (Deleted)
   HPI:  Adriana Peters is a 89 y.o. female, who is here today to follow on recent hospital visit.  Review of Systems See other pertinent positives and negatives in HPI.  Current Outpatient Medications on File Prior to Visit  Medication Sig Dispense Refill   thyroid (ARMOUR THYROID) 60 MG tablet TAKE 1 TABLET BY MOUTH EVERY DAY WITH BREAKFAST 10 tablet 0   vancomycin (VANCOCIN) 125 MG capsule Take 1 capsule (125 mg total) by mouth 4 (four) times daily for 8 days. 32 capsule 0   No current facility-administered medications on file prior to visit.    Past Medical History:  Diagnosis Date   Arthritis    GERD (gastroesophageal reflux disease)    Osteoporosis    Thyroid disease    Hypothyroidism.   No Known Allergies  Social History   Socioeconomic History   Marital status: Married    Spouse name: Not on file   Number of children: Not on file   Years of education: Not on file   Highest education level: Not on file  Occupational History   Not on file  Tobacco Use   Smoking status: Never   Smokeless tobacco: Never  Vaping Use   Vaping Use: Never used  Substance and Sexual Activity   Alcohol use: Not Currently    Comment: Wine   Drug use: No   Sexual activity: Not Currently  Other Topics Concern   Not on file  Social History Narrative   Not on file   Social Determinants of Health   Financial Resource Strain: Low Risk  (10/27/2019)   Overall Financial Resource Strain (CARDIA)    Difficulty of Paying Living Expenses: Not hard at all  Food Insecurity: No Food Insecurity (04/09/2022)   Hunger Vital Sign    Worried About Running Out of Food in the Last Year: Never true    Ran Out of Food in the Last Year: Never true  Transportation Needs: No Transportation Needs (04/09/2022)   PRAPARE - Hydrologist (Medical): No    Lack of Transportation (Non-Medical): No  Physical Activity: Sufficiently Active (10/27/2019)   Exercise Vital Sign     Days of Exercise per Week: 5 days    Minutes of Exercise per Session: 30 min  Stress: No Stress Concern Present (10/27/2019)   [place]    Feeling of Stress : Only a little  Social Connections: Moderately Isolated (10/27/2019)   Social Connection and Isolation Panel [NHANES]    Frequency of Communication with Friends and Family: Once a week    Frequency of Social Gatherings with Friends and Family: More than three times a week    Attends Religious Services: Never    Marine scientist or Organizations: No    Attends Archivist Meetings: Never    Marital Status: Married    There were no vitals filed for this visit. There is no height or weight on file to calculate BMI.  Physical Exam  ASSESSMENT AND PLAN:  There are no diagnoses linked to this encounter.  No orders of the defined types were placed in this encounter.   No problem-specific Assessment & Plan notes found for this encounter.   No follow-ups on file.  [name]. [name], MD  [facility]. [place] office.

## 2022-04-11 LAB — CULTURE, BLOOD (ROUTINE X 2)
Culture: NO GROWTH
Culture: NO GROWTH
Special Requests: ADEQUATE

## 2022-04-12 ENCOUNTER — Inpatient Hospital Stay: Payer: Medicare HMO | Admitting: Family Medicine
